# Patient Record
Sex: Male | Born: 1970 | Race: Black or African American | Hispanic: No | Marital: Married | Smoking: Never smoker
Health system: Southern US, Community
[De-identification: ages and names within clinical notes are randomized; demographics above are authoritative.]

## PROBLEM LIST (undated history)

## (undated) DIAGNOSIS — H269 Unspecified cataract: Secondary | ICD-10-CM

## (undated) DIAGNOSIS — K219 Gastro-esophageal reflux disease without esophagitis: Secondary | ICD-10-CM

## (undated) DIAGNOSIS — I10 Essential (primary) hypertension: Secondary | ICD-10-CM

## (undated) DIAGNOSIS — E119 Type 2 diabetes mellitus without complications: Secondary | ICD-10-CM

## (undated) DIAGNOSIS — F32A Depression, unspecified: Secondary | ICD-10-CM

## (undated) DIAGNOSIS — F329 Major depressive disorder, single episode, unspecified: Secondary | ICD-10-CM

## (undated) DIAGNOSIS — J302 Other seasonal allergic rhinitis: Secondary | ICD-10-CM

## (undated) HISTORY — DX: Essential (primary) hypertension: I10

## (undated) HISTORY — PX: OTHER SURGICAL HISTORY: SHX169

## (undated) HISTORY — DX: Type 2 diabetes mellitus without complications: E11.9

## (undated) HISTORY — DX: Unspecified cataract: H26.9

## (undated) HISTORY — PX: EYE SURGERY: SHX253

---

## 1999-09-04 ENCOUNTER — Emergency Department (HOSPITAL_COMMUNITY): Admission: EM | Admit: 1999-09-04 | Discharge: 1999-09-04 | Payer: Self-pay | Admitting: Emergency Medicine

## 2001-09-22 ENCOUNTER — Emergency Department (HOSPITAL_COMMUNITY): Admission: EM | Admit: 2001-09-22 | Discharge: 2001-09-22 | Payer: Self-pay

## 2004-08-23 HISTORY — PX: HAMMER TOE SURGERY: SHX385

## 2006-09-14 ENCOUNTER — Emergency Department (HOSPITAL_COMMUNITY): Admission: EM | Admit: 2006-09-14 | Discharge: 2006-09-14 | Payer: Self-pay | Admitting: Emergency Medicine

## 2006-09-24 ENCOUNTER — Emergency Department (HOSPITAL_COMMUNITY): Admission: EM | Admit: 2006-09-24 | Discharge: 2006-09-24 | Payer: Self-pay | Admitting: Emergency Medicine

## 2006-10-26 ENCOUNTER — Encounter: Admission: RE | Admit: 2006-10-26 | Discharge: 2006-10-26 | Payer: Self-pay | Admitting: Internal Medicine

## 2007-04-06 ENCOUNTER — Emergency Department (HOSPITAL_COMMUNITY): Admission: EM | Admit: 2007-04-06 | Discharge: 2007-04-06 | Payer: Self-pay | Admitting: Emergency Medicine

## 2007-05-02 ENCOUNTER — Encounter: Admission: RE | Admit: 2007-05-02 | Discharge: 2007-07-02 | Payer: Self-pay | Admitting: Internal Medicine

## 2008-02-17 ENCOUNTER — Encounter: Admission: RE | Admit: 2008-02-17 | Discharge: 2008-02-17 | Payer: Self-pay | Admitting: Internal Medicine

## 2008-03-20 ENCOUNTER — Encounter: Admission: RE | Admit: 2008-03-20 | Discharge: 2008-05-22 | Payer: Self-pay | Admitting: Internal Medicine

## 2009-08-23 DIAGNOSIS — H269 Unspecified cataract: Secondary | ICD-10-CM

## 2009-08-23 HISTORY — DX: Unspecified cataract: H26.9

## 2009-09-09 ENCOUNTER — Emergency Department (HOSPITAL_COMMUNITY): Admission: EM | Admit: 2009-09-09 | Discharge: 2009-09-09 | Payer: Self-pay | Admitting: Emergency Medicine

## 2015-08-11 ENCOUNTER — Ambulatory Visit (INDEPENDENT_AMBULATORY_CARE_PROVIDER_SITE_OTHER): Payer: BLUE CROSS/BLUE SHIELD

## 2015-08-11 ENCOUNTER — Ambulatory Visit (INDEPENDENT_AMBULATORY_CARE_PROVIDER_SITE_OTHER): Payer: BLUE CROSS/BLUE SHIELD | Admitting: Urgent Care

## 2015-08-11 VITALS — BP 162/100 | HR 80 | Temp 99.2°F | Resp 16 | Ht 72.0 in | Wt 282.0 lb

## 2015-08-11 DIAGNOSIS — I10 Essential (primary) hypertension: Secondary | ICD-10-CM | POA: Diagnosis not present

## 2015-08-11 DIAGNOSIS — R05 Cough: Secondary | ICD-10-CM

## 2015-08-11 DIAGNOSIS — R059 Cough, unspecified: Secondary | ICD-10-CM

## 2015-08-11 DIAGNOSIS — J988 Other specified respiratory disorders: Secondary | ICD-10-CM | POA: Diagnosis not present

## 2015-08-11 DIAGNOSIS — E1165 Type 2 diabetes mellitus with hyperglycemia: Secondary | ICD-10-CM

## 2015-08-11 DIAGNOSIS — IMO0001 Reserved for inherently not codable concepts without codable children: Secondary | ICD-10-CM

## 2015-08-11 DIAGNOSIS — R0789 Other chest pain: Secondary | ICD-10-CM

## 2015-08-11 DIAGNOSIS — E669 Obesity, unspecified: Secondary | ICD-10-CM

## 2015-08-11 DIAGNOSIS — J22 Unspecified acute lower respiratory infection: Secondary | ICD-10-CM

## 2015-08-11 DIAGNOSIS — R030 Elevated blood-pressure reading, without diagnosis of hypertension: Secondary | ICD-10-CM | POA: Diagnosis not present

## 2015-08-11 LAB — POCT CBC
Granulocyte percent: 73.9 %G (ref 37–80)
HCT, POC: 46 % (ref 43.5–53.7)
HEMOGLOBIN: 15.5 g/dL (ref 14.1–18.1)
LYMPH, POC: 2.3 (ref 0.6–3.4)
MCH, POC: 28.5 pg (ref 27–31.2)
MCHC: 33.8 g/dL (ref 31.8–35.4)
MCV: 84.4 fL (ref 80–97)
MID (cbc): 0.4 (ref 0–0.9)
MPV: 8.7 fL (ref 0–99.8)
PLATELET COUNT, POC: 232 10*3/uL (ref 142–424)
POC GRANULOCYTE: 7.7 — AB (ref 2–6.9)
POC LYMPH %: 22.4 % (ref 10–50)
POC MID %: 3.7 %M (ref 0–12)
RBC: 5.45 M/uL (ref 4.69–6.13)
RDW, POC: 15.4 %
WBC: 10.4 10*3/uL — AB (ref 4.6–10.2)

## 2015-08-11 LAB — COMPREHENSIVE METABOLIC PANEL
ALK PHOS: 77 U/L (ref 40–115)
ALT: 80 U/L — AB (ref 9–46)
AST: 54 U/L — AB (ref 10–40)
Albumin: 4 g/dL (ref 3.6–5.1)
BUN: 16 mg/dL (ref 7–25)
CO2: 27 mmol/L (ref 20–31)
Calcium: 9.2 mg/dL (ref 8.6–10.3)
Chloride: 101 mmol/L (ref 98–110)
Creat: 0.87 mg/dL (ref 0.60–1.35)
GLUCOSE: 90 mg/dL (ref 65–99)
POTASSIUM: 4.5 mmol/L (ref 3.5–5.3)
Sodium: 139 mmol/L (ref 135–146)
Total Bilirubin: 0.4 mg/dL (ref 0.2–1.2)
Total Protein: 7.1 g/dL (ref 6.1–8.1)

## 2015-08-11 LAB — POCT URINALYSIS DIP (MANUAL ENTRY)
Bilirubin, UA: NEGATIVE
Blood, UA: NEGATIVE
Glucose, UA: NEGATIVE
Ketones, POC UA: NEGATIVE
LEUKOCYTES UA: NEGATIVE
Nitrite, UA: NEGATIVE
PH UA: 7
PROTEIN UA: NEGATIVE
SPEC GRAV UA: 1.02
UROBILINOGEN UA: 0.2

## 2015-08-11 LAB — POCT GLYCOSYLATED HEMOGLOBIN (HGB A1C): HEMOGLOBIN A1C: 6.5

## 2015-08-11 LAB — TSH: TSH: 2.299 u[IU]/mL (ref 0.350–4.500)

## 2015-08-11 MED ORDER — BENZONATATE 100 MG PO CAPS: 100.0000 mg | ORAL_CAPSULE | Freq: Three times a day (TID) | ORAL | Status: DC | PRN

## 2015-08-11 MED ORDER — HYDROCODONE-HOMATROPINE 5-1.5 MG/5ML PO SYRP: 5.0000 mL | ORAL_SOLUTION | Freq: Every evening | ORAL | Status: DC | PRN

## 2015-08-11 MED ORDER — LISINOPRIL-HYDROCHLOROTHIAZIDE 10-12.5 MG PO TABS: 1.0000 | ORAL_TABLET | Freq: Every day | ORAL | Status: DC

## 2015-08-11 MED ORDER — AZITHROMYCIN 250 MG PO TABS: ORAL_TABLET | ORAL | Status: DC

## 2015-08-11 MED ORDER — METFORMIN HCL 500 MG PO TABS: 500.0000 mg | ORAL_TABLET | Freq: Every day | ORAL | Status: DC

## 2015-08-11 NOTE — Patient Instructions (Signed)
Hypertension Hypertension, commonly called high blood pressure, is when the force of blood pumping through your arteries is too strong. Your arteries are the blood vessels that carry blood from your heart throughout your body. A blood pressure reading consists of a higher number over a lower number, such as 110/72. The higher number (systolic) is the pressure inside your arteries when your heart pumps. The lower number (diastolic) is the pressure inside your arteries when your heart relaxes. Ideally you want your blood pressure below 120/80. Hypertension forces your heart to work harder to pump blood. Your arteries may become narrow or stiff. Having untreated or uncontrolled hypertension can cause heart attack, stroke, kidney disease, and other problems. RISK FACTORS Some risk factors for high blood pressure are controllable. Others are not.  Risk factors you cannot control include:   Race. You may be at higher risk if you are African American.  Age. Risk increases with age.  Gender. Men are at higher risk than women before age 45 years. After age 65, women are at higher risk than men. Risk factors you can control include:  Not getting enough exercise or physical activity.  Being overweight.  Getting too much fat, sugar, calories, or salt in your diet.  Drinking too much alcohol. SIGNS AND SYMPTOMS Hypertension does not usually cause signs or symptoms. Extremely high blood pressure (hypertensive crisis) may cause headache, anxiety, shortness of breath, and nosebleed. DIAGNOSIS To check if you have hypertension, your health care provider will measure your blood pressure while you are seated, with your arm held at the level of your heart. It should be measured at least twice using the same arm. Certain conditions can cause a difference in blood pressure between your right and left arms. A blood pressure reading that is higher than normal on one occasion does not mean that you need treatment. If  it is not clear whether you have high blood pressure, you may be asked to return on a different day to have your blood pressure checked again. Or, you may be asked to monitor your blood pressure at home for 1 or more weeks. TREATMENT Treating high blood pressure includes making lifestyle changes and possibly taking medicine. Living a healthy lifestyle can help lower high blood pressure. You may need to change some of your habits. Lifestyle changes may include:  Following the DASH diet. This diet is high in fruits, vegetables, and whole grains. It is low in salt, red meat, and added sugars.  Keep your sodium intake below 2,300 mg per day.  Getting at least 30-45 minutes of aerobic exercise at least 4 times per week.  Losing weight if necessary.  Not smoking.  Limiting alcoholic beverages.  Learning ways to reduce stress. Your health care provider may prescribe medicine if lifestyle changes are not enough to get your blood pressure under control, and if one of the following is true:  You are 18-59 years of age and your systolic blood pressure is above 140.  You are 60 years of age or older, and your systolic blood pressure is above 150.  Your diastolic blood pressure is above 90.  You have diabetes, and your systolic blood pressure is over 140 or your diastolic blood pressure is over 90.  You have kidney disease and your blood pressure is above 140/90.  You have heart disease and your blood pressure is above 140/90. Your personal target blood pressure may vary depending on your medical conditions, your age, and other factors. HOME CARE INSTRUCTIONS    Have your blood pressure rechecked as directed by your health care provider.   Take medicines only as directed by your health care provider. Follow the directions carefully. Blood pressure medicines must be taken as prescribed. The medicine does not work as well when you skip doses. Skipping doses also puts you at risk for  problems.  Do not smoke.   Monitor your blood pressure at home as directed by your health care provider. SEEK MEDICAL CARE IF:   You think you are having a reaction to medicines taken.  You have recurrent headaches or feel dizzy.  You have swelling in your ankles.  You have trouble with your vision. SEEK IMMEDIATE MEDICAL CARE IF:  You develop a severe headache or confusion.  You have unusual weakness, numbness, or feel faint.  You have severe chest or abdominal pain.  You vomit repeatedly.  You have trouble breathing. MAKE SURE YOU:   Understand these instructions.  Will watch your condition.  Will get help right away if you are not doing well or get worse.   This information is not intended to replace advice given to you by your health care provider. Make sure you discuss any questions you have with your health care provider.   Document Released: 08/09/2005 Document Revised: 12/24/2014 Document Reviewed: 06/01/2013 Elsevier Interactive Patient Education 2016 [employer]    Diabetes Mellitus and Food It is important for you to manage your blood sugar (glucose) level. Your blood glucose level can be greatly affected by what you eat. Eating healthier foods in the appropriate amounts throughout the day at about the same time each day will help you control your blood glucose level. It can also help slow or prevent worsening of your diabetes mellitus. Healthy eating may even help you improve the level of your blood pressure and reach or maintain a healthy weight.  General recommendations for healthful eating and cooking habits include:  Eating meals and snacks regularly. Avoid going long periods of time without eating to lose weight.  Eating a diet that consists mainly of plant-based foods, such as fruits, vegetables, nuts, legumes, and whole grains.  Using low-heat cooking methods, such as baking, instead of high-heat cooking methods, such as deep frying. Work with  your dietitian to make sure you understand how to use the Nutrition Facts information on food labels. HOW CAN FOOD AFFECT ME? Carbohydrates Carbohydrates affect your blood glucose level more than any other type of food. Your dietitian will help you determine how many carbohydrates to eat at each meal and teach you how to count carbohydrates. Counting carbohydrates is important to keep your blood glucose at a healthy level, especially if you are using insulin or taking certain medicines for diabetes mellitus. Alcohol Alcohol can cause sudden decreases in blood glucose (hypoglycemia), especially if you use insulin or take certain medicines for diabetes mellitus. Hypoglycemia can be a life-threatening condition. Symptoms of hypoglycemia (sleepiness, dizziness, and disorientation) are similar to symptoms of having too much alcohol.  If your health care provider has given you approval to drink alcohol, do so in moderation and use the following guidelines:  Women should not have more than one drink per day, and men should not have more than two drinks per day. One drink is equal to:  12 oz of beer.  5 oz of wine.  1 oz of hard liquor.  Do not drink on an empty stomach.  Keep yourself hydrated. Have water, diet soda, or unsweetened iced tea.  Regular soda, juice,  and other mixers might contain a lot of carbohydrates and should be counted. WHAT FOODS ARE NOT RECOMMENDED? As you make food choices, it is important to remember that all foods are not the same. Some foods have fewer nutrients per serving than other foods, even though they might have the same number of calories or carbohydrates. It is difficult to get your body what it needs when you eat foods with fewer nutrients. Examples of foods that you should avoid that are high in calories and carbohydrates but low in nutrients include:  Trans fats (most processed foods list trans fats on the Nutrition Facts label).  Regular  soda.  Juice.  Candy.  Sweets, such as cake, pie, doughnuts, and cookies.  Fried foods. WHAT FOODS CAN I EAT? Eat nutrient-rich foods, which will nourish your body and keep you healthy. The food you should eat also will depend on several factors, including:  The calories you need.  The medicines you take.  Your weight.  Your blood glucose level.  Your blood pressure level.  Your cholesterol level. You should eat a variety of foods, including:  Protein.  Lean cuts of meat.  Proteins low in saturated fats, such as fish, egg whites, and beans. Avoid processed meats.  Fruits and vegetables.  Fruits and vegetables that may help control blood glucose levels, such as apples, mangoes, and yams.  Dairy products.  Choose fat-free or low-fat dairy products, such as milk, yogurt, and cheese.  Grains, bread, pasta, and rice.  Choose whole grain products, such as multigrain bread, whole oats, and brown rice. These foods may help control blood pressure.  Fats.  Foods containing healthful fats, such as nuts, avocado, olive oil, canola oil, and fish. DOES EVERYONE WITH DIABETES MELLITUS HAVE THE SAME MEAL PLAN? Because every person with diabetes mellitus is different, there is not one meal plan that works for everyone. It is very important that you meet with a dietitian who will help you create a meal plan that is just right for you.   This information is not intended to replace advice given to you by your health care provider. Make sure you discuss any questions you have with your health care provider.   Document Released: 05/06/2005 Document Revised: 08/30/2014 Document Reviewed: 07/06/2013 Elsevier Interactive Patient Education [employer]

## 2015-08-11 NOTE — Progress Notes (Signed)
MRN: [id number] DOB: [birth year]  Subjective:   Trevor Garcia is a 44 y.o. male presenting for chief complaint of Sinusitis and Cough  Reports 4 day history of worsening cough which elicits chest discomfort and dizziness. He has also had sinus congestion, headache, scratchy throat, nausea. Has tried NyQuil, Neti pot, Mucinex with minimal relief. Denies fever, chest tightness, heart racing, vomiting, abdominal pain, lower leg swelling, ear pain, ear drainage, tooth pain, double vision, blurred vision. Admits that he has had a previous high blood pressure reading that qualified for hypertension. Diet is very unhealthy, eats a lot of fried foods, no exercise. Denies history of diabetes and heart disease. Denies smoking cigarettes or drinking alcohol.   Trevor Garcia currently has no medications in their medication list. Also has No Known Allergies.  Trevor Garcia  has no past medical history on file. Also  has no past surgical history on file.  Objective:   Vitals: BP 162/100 mmHg  Pulse 80  Temp(Src) 99.2 F (37.3 C) (Oral)  Resp 16  Ht 6' (1.829 m)  Wt 282 lb (127.914 kg)  BMI 38.24 kg/m2  SpO2 98%  Physical Exam  Constitutional: He is oriented to person, place, and time. He appears well-developed and well-nourished.  Body habitus is obese.  Cardiovascular: Normal rate, regular rhythm and intact distal pulses.  Exam reveals no gallop and no friction rub.   No murmur heard. Pulmonary/Chest: No respiratory distress. He has no wheezes. He has no rales.  Abdominal: Soft. Bowel sounds are normal. He exhibits no distension and no mass. There is no tenderness.  Neurological: He is alert and oriented to person, place, and time.  Skin: Skin is warm and dry. No rash noted. No erythema. No pallor.    Results for orders placed or performed in visit on 08/11/15 (from the past 24 hour(s))  POCT urinalysis dipstick     Status: None   Collection Time: 08/11/15  3:35 PM  Result Value Ref Range   Color, UA  yellow yellow   Clarity, UA clear clear   Glucose, UA negative negative   Bilirubin, UA negative negative   Ketones, POC UA negative negative   Spec Grav, UA 1.020    Blood, UA negative negative   pH, UA 7.0    Protein Ur, POC negative negative   Urobilinogen, UA 0.2    Nitrite, UA Negative Negative   Leukocytes, UA Negative Negative  POCT glycosylated hemoglobin (Hb A1C)     Status: None   Collection Time: 08/11/15  3:35 PM  Result Value Ref Range   Hemoglobin A1C 6.5   POCT CBC     Status: Abnormal   Collection Time: 08/11/15  3:35 PM  Result Value Ref Range   WBC 10.4 (A) 4.6 - 10.2 K/uL   Lymph, poc 2.3 0.6 - 3.4   POC LYMPH PERCENT 22.4 10 - 50 %L   MID (cbc) 0.4 0 - 0.9   POC MID % 3.7 0 - 12 %M   POC Granulocyte 7.7 (A) 2 - 6.9   Granulocyte percent 73.9 37 - 80 %G   RBC 5.45 4.69 - 6.13 M/uL   Hemoglobin 15.5 14.1 - 18.1 g/dL   HCT, POC 40.9 81.1 - 53.7 %   MCV 84.4 80 - 97 fL   MCH, POC 28.5 27 - 31.2 pg   MCHC 33.8 31.8 - 35.4 g/dL   RDW, POC 91.4 %   Platelet Count, POC 232 142 - 424 K/uL   MPV  8.7 0 - 99.8 fL   Assessment and Plan :   1. Cough -  Likely related to infectious process, unlikely to be heart failure or PE. See #7  2. Elevated blood pressure 3. Essential hypertension 4. Obesity - Start lis-HCT combo. RTC in 4 weeks for recheck. Advised lifestyle modifications.  5. Atypical chest pain - Likely related to infectious process, see #7  6. Uncontrolled type 2 diabetes mellitus without complication, without long-term current use of insulin (HCC) - Start metformin twice daily. Recommended significant dietary and lifestyle modifications.  7. Lower respiratory infection - Start Azithromycin, recheck in 1 week if no improvement  Wallis BambergMario Greyden Besecker, PA-C [facility] [phone] 08/11/2015 2:59 PM

## 2015-08-12 ENCOUNTER — Encounter: Payer: Self-pay | Admitting: Urgent Care

## 2015-09-11 ENCOUNTER — Ambulatory Visit (INDEPENDENT_AMBULATORY_CARE_PROVIDER_SITE_OTHER): Payer: BLUE CROSS/BLUE SHIELD | Admitting: Urgent Care

## 2015-09-11 ENCOUNTER — Encounter: Payer: Self-pay | Admitting: Urgent Care

## 2015-09-11 VITALS — BP 146/77 | HR 78 | Temp 98.5°F | Resp 16 | Ht 71.0 in | Wt 278.2 lb

## 2015-09-11 DIAGNOSIS — E119 Type 2 diabetes mellitus without complications: Secondary | ICD-10-CM

## 2015-09-11 DIAGNOSIS — Z0000 Encounter for general adult medical examination without abnormal findings: Secondary | ICD-10-CM | POA: Diagnosis not present

## 2015-09-11 DIAGNOSIS — E669 Obesity, unspecified: Secondary | ICD-10-CM

## 2015-09-11 DIAGNOSIS — L989 Disorder of the skin and subcutaneous tissue, unspecified: Secondary | ICD-10-CM | POA: Diagnosis not present

## 2015-09-11 DIAGNOSIS — Z23 Encounter for immunization: Secondary | ICD-10-CM

## 2015-09-11 DIAGNOSIS — I10 Essential (primary) hypertension: Secondary | ICD-10-CM | POA: Diagnosis not present

## 2015-09-11 LAB — LIPID PANEL
CHOLESTEROL: 164 mg/dL (ref 125–200)
HDL: 30 mg/dL — ABNORMAL LOW (ref >=40)
LDL Cholesterol: 110 mg/dL (ref <130)
Total CHOL/HDL Ratio: 5.5 Ratio — ABNORMAL HIGH (ref <=5.0)
Triglycerides: 122 mg/dL (ref <150)
VLDL: 24 mg/dL (ref <30)

## 2015-09-11 LAB — CBC
HCT: 45.6 % (ref 39.0–52.0)
Hemoglobin: 15.4 g/dL (ref 13.0–17.0)
MCH: 28.3 pg (ref 26.0–34.0)
MCHC: 33.8 g/dL (ref 30.0–36.0)
MCV: 83.7 fL (ref 78.0–100.0)
MPV: 11.5 fL (ref 8.6–12.4)
PLATELETS: 265 10*3/uL (ref 150–400)
RBC: 5.45 MIL/uL (ref 4.22–5.81)
RDW: 15.4 % (ref 11.5–15.5)
WBC: 10.3 10*3/uL (ref 4.0–10.5)

## 2015-09-11 LAB — COMPREHENSIVE METABOLIC PANEL
ALK PHOS: 77 U/L (ref 40–115)
ALT: 89 U/L — AB (ref 9–46)
AST: 59 U/L — AB (ref 10–40)
Albumin: 4.2 g/dL (ref 3.6–5.1)
BUN: 17 mg/dL (ref 7–25)
CO2: 24 mmol/L (ref 20–31)
CREATININE: 0.86 mg/dL (ref 0.60–1.35)
Calcium: 9.6 mg/dL (ref 8.6–10.3)
Chloride: 102 mmol/L (ref 98–110)
GLUCOSE: 92 mg/dL (ref 65–99)
Potassium: 4.3 mmol/L (ref 3.5–5.3)
SODIUM: 138 mmol/L (ref 135–146)
TOTAL PROTEIN: 7 g/dL (ref 6.1–8.1)
Total Bilirubin: 0.8 mg/dL (ref 0.2–1.2)

## 2015-09-11 LAB — TSH: TSH: 0.914 u[IU]/mL (ref 0.350–4.500)

## 2015-09-11 NOTE — Progress Notes (Signed)
MRN: [id number]  Subjective:   Trevor Garcia is a 45 y.o. male presenting for annual physical exam.  Medical care team includes: PCP: No primary care provider on file. Vision: Needs Dental: Gets dental cleanings, has an appointment scheduled for 09/215. [facility] Works in [place], [place]. Specialists: None.   Patient works with [employer], is married to his wife for ~20 years. They do not have children but have a chihuahua that they love dearly. Patient states that he has good relationship with his wife at home, has good support network. Denies smoking cigarettes or drinking alcohol.   HTN - managed with low dose lis-HCTZ. He admits good compliance, misses some doses. However, patient has had significant dietary modifications and has started exercising 3 times a week. Denies lightheadedness, dizziness, chronic headache, double vision, chest pain, shortness of breath, heart racing, palpitations, nausea, vomiting, abdominal pain, hematuria, lower leg swelling.   DM - managed with Metformin  BID. Diet and exercise as above. Denies blurred vision, polyuria, polydipsia, foot infections, numbness or tingling.  Trevor Garcia  does not have a problem list on file.  Trevor Garcia has a current medication list which includes the following prescription(s): lisinopril-hydrochlorothiazide and metformin. He has No Known Allergies.  Trevor Garcia  has a past medical history of Cataract (2011); Hypertension; and Diabetes mellitus without complication (HCC). Also  has no past surgical history on file.  His family history includes GER disease in his sister; Lupus in his mother.  Immunizations: Not interested in flu shots, TDAP is due.  Review of Systems  Constitutional: Negative for fever, chills, weight loss, malaise/fatigue and diaphoresis.  HENT: Negative for congestion, ear discharge, ear pain, hearing loss, nosebleeds, sore throat and tinnitus.   Eyes: Negative for blurred vision, double vision, photophobia, pain,  discharge and redness.  Respiratory: Negative for cough, shortness of breath and wheezing.   Cardiovascular: Negative for chest pain, palpitations and leg swelling.  Gastrointestinal: Negative for nausea, vomiting, abdominal pain, diarrhea, constipation and blood in stool.  Genitourinary: Negative for dysuria, urgency, frequency, hematuria and flank pain.  Musculoskeletal: Negative for myalgias, back pain and joint pain.  Skin: Negative for itching and rash.  Neurological: Negative for dizziness, tingling, seizures, loss of consciousness, weakness and headaches.  Endo/Heme/Allergies: Negative for polydipsia.  Psychiatric/Behavioral: Negative for depression, suicidal ideas, hallucinations, memory loss and substance abuse. The patient is not nervous/anxious and does not have insomnia.    Objective:   Vitals: BP 146/77 mmHg  Pulse 78  Temp(Src) 98.5 F (36.9 C) (Oral)  Resp 16  Ht  (1.803 m)  Wt 278 lb 3.2 oz (126.191 kg)  BMI 38.82 kg/m2  SpO2 96%  BP Readings from Last 3 Encounters:  09/11/15 146/77  08/11/15 162/100   Physical Exam  Constitutional: He is oriented to person, place, and time. He appears well-developed and well-nourished.  HENT:  TM's intact bilaterally, no effusions or erythema. Nasal turbinates pink and moist, nasal passages patent. No sinus tenderness. Oropharynx clear, mucous membranes moist, dentition in good repair.  Eyes: Conjunctivae and EOM are normal. Pupils are equal, round, and reactive to light. Right eye exhibits no discharge. Left eye exhibits no discharge. No scleral icterus.  Neck: Normal range of motion. Neck supple. No thyromegaly present.  Cardiovascular: Normal rate, regular rhythm and intact distal pulses.  Exam reveals no gallop and no friction rub.   No murmur heard. Pulmonary/Chest: No stridor. No respiratory distress. He has no wheezes. He has no rales.  Abdominal: Soft. Bowel sounds  are normal. He exhibits no distension and no  mass. There is no tenderness.  Musculoskeletal: Normal range of motion. He exhibits no edema or tenderness.  Lymphadenopathy:    He has no cervical adenopathy.  Neurological: He is alert and oriented to person, place, and time. He has normal reflexes.  Skin: Skin is warm and dry. No rash noted. No erythema. No pallor.  Multiple hyperkeratotic growths over limbs, chest and posterior scalp.  Psychiatric: He has a normal mood and affect.   Assessment and Plan :   1. Annual physical exam - Medically stable, labs pending - Discussed healthy lifestyle, diet, exercise, preventative care, vaccinations, and addressed patient's concerns.   2. Skin lesions - Multiple hyperkeratotic skin lesions throughout body, patient would like these evaluated by a dermatologist. - Ambulatory referral to Dermatology  3. Essential hypertension 4. Obesity 5. Type 2 diabetes mellitus without complication, without long-term current use of insulin (HCC) - Continue with current regimen for HTN, DM. Encouraged to continue with healthy diet and exercise. Recheck in 2 months for diabetes.  6. Need for Tdap vaccination - Tdap vaccine greater than or equal to 7yo IM   Wallis Bamberg, PA-C [facility] [phone] 09/11/2015  2:31 PM

## 2015-09-11 NOTE — Patient Instructions (Addendum)
Keeping you healthy  Get these tests  Blood pressure- Have your blood pressure checked once a year by your healthcare provider.  Normal blood pressure is 120/80.  Weight- Have your body mass index (BMI) calculated to screen for obesity.  BMI is a measure of body fat based on height and weight. You can also calculate your own BMI at [email or web address]/.  Cholesterol- Have your cholesterol checked regularly starting at age 46, sooner may be necessary if you have diabetes, high blood pressure, if a family member developed heart diseases at an early age or if you smoke.   Chlamydia, HIV, and other sexual transmitted disease- Get screened each year until the age of 25 then within three months of each new sexual partner.  Diabetes- Have your blood sugar checked regularly if you have high blood pressure, high cholesterol, a family history of diabetes or if you are overweight.  Get these vaccines  Flu shot- Every fall.  Tetanus shot- Every 10 years.  Menactra- Single dose; prevents meningitis.  Take these steps  Don't smoke- If you do smoke, ask your healthcare provider about quitting. For tips on how to quit, go to www.smokefree.gov or call 1-800-QUIT-NOW.  Be physically active- Exercise 5 days a week for at least 30 minutes.  If you are not already physically active start slow and gradually work up to 30 minutes of moderate physical activity.  Examples of moderate activity include walking briskly, mowing the yard, dancing, swimming bicycling, etc.  Eat a healthy diet- Eat a variety of healthy foods such as fruits, vegetables, low fat milk, low fat cheese, yogurt, lean meats, poultry, fish, beans, tofu, etc.  For more information on healthy eating, go to www.thenutritionsource.org  Drink alcohol in moderation- Limit alcohol intake two drinks or less a day.  Never drink and drive.  Dentist- Brush and floss teeth twice daily; visit your dentis twice a year.  Depression-Your emotional  health is as important as your physical health.  If you're feeling down, losing interest in things you normally enjoy please talk with your healthcare provider.  Gun Safety- If you keep a gun in your home, keep it unloaded and with the safety lock on.  Bullets should be stored separately.  Helmet use- Always wear a helmet when riding a motorcycle, bicycle, rollerblading or skateboarding.  Safe sex- If you may be exposed to a sexually transmitted infection, use a condom  Seat belts- Seat bels can save your life; always wear one.  Smoke/Carbon Monoxide detectors- These detectors need to be installed on the appropriate level of your home.  Replace batteries at least once a year.  Skin Cancer- When out in the sun, cover up and use sunscreen SPF 15 or higher.  Violence- If anyone is threatening or hurting you, please tell your healthcare provider.    Hypertension Hypertension, commonly called high blood pressure, is when the force of blood pumping through your arteries is too strong. Your arteries are the blood vessels that carry blood from your heart throughout your body. A blood pressure reading consists of a higher number over a lower number, such as 110/72. The higher number (systolic) is the pressure inside your arteries when your heart pumps. The lower number (diastolic) is the pressure inside your arteries when your heart relaxes. Ideally you want your blood pressure below 120/80. Hypertension forces your heart to work harder to pump blood. Your arteries may become narrow or stiff. Having untreated or uncontrolled hypertension can cause heart attack, stroke, kidney disease, and   other problems. RISK FACTORS Some risk factors for high blood pressure are controllable. Others are not.  Risk factors you cannot control include:   Race. You may be at higher risk if you are African American.  Age. Risk increases with age.  Gender. Men are at higher risk than women before age 45 years. After  age 65, women are at higher risk than men. Risk factors you can control include:  Not getting enough exercise or physical activity.  Being overweight.  Getting too much fat, sugar, calories, or salt in your diet.  Drinking too much alcohol. SIGNS AND SYMPTOMS Hypertension does not usually cause signs or symptoms. Extremely high blood pressure (hypertensive crisis) may cause headache, anxiety, shortness of breath, and nosebleed. DIAGNOSIS To check if you have hypertension, your health care provider will measure your blood pressure while you are seated, with your arm held at the level of your heart. It should be measured at least twice using the same arm. Certain conditions can cause a difference in blood pressure between your right and left arms. A blood pressure reading that is higher than normal on one occasion does not mean that you need treatment. If it is not clear whether you have high blood pressure, you may be asked to return on a different day to have your blood pressure checked again. Or, you may be asked to monitor your blood pressure at home for 1 or more weeks. TREATMENT Treating high blood pressure includes making lifestyle changes and possibly taking medicine. Living a healthy lifestyle can help lower high blood pressure. You may need to change some of your habits. Lifestyle changes may include:  Following the DASH diet. This diet is high in fruits, vegetables, and whole grains. It is low in salt, red meat, and added sugars.  Keep your sodium intake below 2,300 mg per day.  Getting at least 30-45 minutes of aerobic exercise at least 4 times per week.  Losing weight if necessary.  Not smoking.  Limiting alcoholic beverages.  Learning ways to reduce stress. Your health care provider may prescribe medicine if lifestyle changes are not enough to get your blood pressure under control, and if one of the following is true:  You are 18-59 years of age and your systolic blood  pressure is above 140.  You are 60 years of age or older, and your systolic blood pressure is above 150.  Your diastolic blood pressure is above 90.  You have diabetes, and your systolic blood pressure is over 140 or your diastolic blood pressure is over 90.  You have kidney disease and your blood pressure is above 140/90.  You have heart disease and your blood pressure is above 140/90. Your personal target blood pressure may vary depending on your medical conditions, your age, and other factors. HOME CARE INSTRUCTIONS  Have your blood pressure rechecked as directed by your health care provider.   Take medicines only as directed by your health care provider. Follow the directions carefully. Blood pressure medicines must be taken as prescribed. The medicine does not work as well when you skip doses. Skipping doses also puts you at risk for problems.  Do not smoke.   Monitor your blood pressure at home as directed by your health care provider. SEEK MEDICAL CARE IF:   You think you are having a reaction to medicines taken.  You have recurrent headaches or feel dizzy.  You have swelling in your ankles.  You have trouble with your vision. SEEK IMMEDIATE   CARE IF:  You develop a severe headache or confusion.  You have unusual weakness, numbness, or feel faint.  You have severe chest or abdominal pain.  You vomit repeatedly.  You have trouble breathing. MAKE SURE YOU:   Understand these instructions.  Will watch your condition.  Will get help right away if you are not doing well or get worse.   This information is not intended to replace advice given to you by your health care provider. Make sure you discuss any questions you have with your health care provider.   Document Released: 08/09/2005 Document Revised: 12/24/2014 Document Reviewed: 06/01/2013 Elsevier Interactive Patient Education 2016 [employer].    Diabetes Mellitus and Food It is important  for you to manage your blood sugar (glucose) level. Your blood glucose level can be greatly affected by what you eat. Eating healthier foods in the appropriate amounts throughout the day at about the same time each day will help you control your blood glucose level. It can also help slow or prevent worsening of your diabetes mellitus. Healthy eating may even help you improve the level of your blood pressure and reach or maintain a healthy weight.  General recommendations for healthful eating and cooking habits include:  Eating meals and snacks regularly. Avoid going long periods of time without eating to lose weight.  Eating a diet that consists mainly of plant-based foods, such as fruits, vegetables, nuts, legumes, and whole grains.  Using low-heat cooking methods, such as baking, instead of high-heat cooking methods, such as deep frying. Work with your dietitian to make sure you understand how to use the Nutrition Facts information on food labels. HOW CAN FOOD AFFECT ME? Carbohydrates Carbohydrates affect your blood glucose level more than any other type of food. Your dietitian will help you determine how many carbohydrates to eat at each meal and teach you how to count carbohydrates. Counting carbohydrates is important to keep your blood glucose at a healthy level, especially if you are using insulin or taking certain medicines for diabetes mellitus. Alcohol Alcohol can cause sudden decreases in blood glucose (hypoglycemia), especially if you use insulin or take certain medicines for diabetes mellitus. Hypoglycemia can be a life-threatening condition. Symptoms of hypoglycemia (sleepiness, dizziness, and disorientation) are similar to symptoms of having too much alcohol.  If your health care provider has given you approval to drink alcohol, do so in moderation and use the following guidelines:  Women should not have more than one drink per day, and men should not have more than two drinks per day.  One drink is equal to:  12 oz of beer.  5 oz of wine.  1 oz of hard liquor.  Do not drink on an empty stomach.  Keep yourself hydrated. Have water, diet soda, or unsweetened iced tea.  Regular soda, juice, and other mixers might contain a lot of carbohydrates and should be counted. WHAT FOODS ARE NOT RECOMMENDED? As you make food choices, it is important to remember that all foods are not the same. Some foods have fewer nutrients per serving than other foods, even though they might have the same number of calories or carbohydrates. It is difficult to get your body what it needs when you eat foods with fewer nutrients. Examples of foods that you should avoid that are high in calories and carbohydrates but low in nutrients include:  Trans fats (most processed foods list trans fats on the Nutrition Facts label).  Regular soda.  Juice.  Candy.  Sweets,  such as cake, pie, doughnuts, and cookies.  Fried foods. WHAT FOODS CAN I EAT? Eat nutrient-rich foods, which will nourish your body and keep you healthy. The food you should eat also will depend on several factors, including:  The calories you need.  The medicines you take.  Your weight.  Your blood glucose level.  Your blood pressure level.  Your cholesterol level. You should eat a variety of foods, including:  Protein.  Lean cuts of meat.  Proteins low in saturated fats, such as fish, egg whites, and beans. Avoid processed meats.  Fruits and vegetables.  Fruits and vegetables that may help control blood glucose levels, such as apples, mangoes, and yams.  Dairy products.  Choose fat-free or low-fat dairy products, such as milk, yogurt, and cheese.  Grains, bread, pasta, and rice.  Choose whole grain products, such as multigrain bread, whole oats, and brown rice. These foods may help control blood pressure.  Fats.  Foods containing healthful fats, such as nuts, avocado, olive oil, canola oil, and fish. DOES  EVERYONE WITH DIABETES MELLITUS HAVE THE SAME MEAL PLAN? Because every person with diabetes mellitus is different, there is not one meal plan that works for everyone. It is very important that you meet with a dietitian who will help you create a meal plan that is just right for you.   This information is not intended to replace advice given to you by your health care provider. Make sure you discuss any questions you have with your health care provider.   Document Released: 05/06/2005 Document Revised: 08/30/2014 Document Reviewed: 07/06/2013 Elsevier Interactive Patient Education [employer]

## 2015-09-18 ENCOUNTER — Encounter: Payer: Self-pay | Admitting: Urgent Care

## 2015-11-06 ENCOUNTER — Ambulatory Visit: Payer: BLUE CROSS/BLUE SHIELD | Admitting: Urgent Care

## 2015-11-20 ENCOUNTER — Ambulatory Visit: Payer: BLUE CROSS/BLUE SHIELD | Admitting: Urgent Care

## 2016-10-21 ENCOUNTER — Encounter: Payer: BLUE CROSS/BLUE SHIELD | Admitting: Urgent Care

## 2016-11-25 ENCOUNTER — Ambulatory Visit (INDEPENDENT_AMBULATORY_CARE_PROVIDER_SITE_OTHER): Payer: BLUE CROSS/BLUE SHIELD | Admitting: Physician Assistant

## 2016-11-25 VITALS — BP 150/100 | HR 80 | Temp 98.1°F | Resp 18 | Ht 71.0 in | Wt 296.8 lb

## 2016-11-25 DIAGNOSIS — J209 Acute bronchitis, unspecified: Secondary | ICD-10-CM

## 2016-11-25 DIAGNOSIS — R05 Cough: Secondary | ICD-10-CM | POA: Diagnosis not present

## 2016-11-25 DIAGNOSIS — R0981 Nasal congestion: Secondary | ICD-10-CM | POA: Diagnosis not present

## 2016-11-25 DIAGNOSIS — R059 Cough, unspecified: Secondary | ICD-10-CM

## 2016-11-25 MED ORDER — HYDROCODONE-HOMATROPINE 5-1.5 MG/5ML PO SYRP: 5.0000 mL | ORAL_SOLUTION | Freq: Three times a day (TID) | ORAL | 0 refills | Status: DC | PRN

## 2016-11-25 MED ORDER — AZITHROMYCIN 250 MG PO TABS: ORAL_TABLET | ORAL | 0 refills | Status: DC

## 2016-11-25 MED ORDER — MUCINEX DM MAXIMUM STRENGTH 60-1200 MG PO TB12: 1.0000 | ORAL_TABLET | Freq: Two times a day (BID) | ORAL | 1 refills | Status: DC

## 2016-11-25 MED ORDER — FLUTICASONE PROPIONATE 50 MCG/ACT NA SUSP: 2.0000 | Freq: Every day | NASAL | 6 refills | Status: DC

## 2016-11-25 NOTE — Patient Instructions (Addendum)
Drink 2-3 liters/water a day - especially while taking mucinex. This will help thin out your mucus to help you clear it.  Hycodan is a controlled substance. Please take this as directed - it is intended to help you get a good night's rest.  Flonase - 2 sprays each nostril 2x/day - one of those doses should be before bedtime.  Warm tea with honey to help soothe your sore throat. Warm salt water gargles will help with this as well.   Check out Hello Fresh, Blue apron, Fresh 20.   Thank you for coming in today. I hope you feel we met your needs.  Feel free to call [facility] if you have any questions or further requests.  Please consider signing up for MyChart if you do not already have it, as this is a great way to communicate with me.  Best,  [employer], PA-C   Exercising to [employer] Exercising can help you to lose weight. In order to lose weight through exercise, you need to do vigorous-intensity exercise. You can tell that you are exercising with vigorous intensity if you are breathing very hard and fast and cannot hold a conversation while exercising. Moderate-intensity exercise helps to maintain your current weight. You can tell that you are exercising at a moderate level if you have a higher heart rate and faster breathing, but you are still able to hold a conversation. How often should I exercise? Choose an activity that you enjoy and set realistic goals. Your health care provider can help you to make an activity plan that works for you. Exercise regularly as directed by your health care provider. This may include:  Doing resistance training twice each week, such as:  Push-ups.  Sit-ups.  Lifting weights.  Using resistance bands.  Doing a given intensity of exercise for a given amount of time. Choose from these options:  150 minutes of moderate-intensity exercise every week.  75 minutes of vigorous-intensity exercise every week.  A mix of moderate-intensity and  vigorous-intensity exercise every week. Children, pregnant women, people who are out of shape, people who are overweight, and older adults may need to consult a health care provider for individual recommendations. If you have any sort of medical condition, be sure to consult your health care provider before starting a new exercise program. What are some activities that can help me to lose weight?  Walking at a rate of at least 4.5 miles an hour.  Jogging or running at a rate of 5 miles per hour.  Biking at a rate of at least 10 miles per hour.  Lap swimming.  Roller-skating or in-line skating.  Cross-country skiing.  Vigorous competitive sports, such as football, basketball, and soccer.  Jumping rope.  Aerobic dancing. How can I be more active in my day-to-day activities?  Use the stairs instead of the elevator.  Take a walk during your lunch break.  If you drive, park your car farther away from work or school.  If you take public transportation, get off one stop early and walk the rest of the way.  Make all of your phone calls while standing up and walking around.  Get up, stretch, and walk around every 30 minutes throughout the day. What guidelines should I follow while exercising?  Do not exercise so much that you hurt yourself, feel dizzy, or get very short of breath.  Consult your health care provider prior to starting a new exercise program.  Wear comfortable clothes and shoes with  good support.  Drink plenty of water while you exercise to prevent dehydration or heat stroke. Body water is lost during exercise and must be replaced.  Work out until you breathe faster and your heart beats faster. This information is not intended to replace advice given to you by your health care provider. Make sure you discuss any questions you have with your health care provider. Document Released: 09/11/2010 Document Revised: 01/15/2016 Document Reviewed: 01/10/2014 Elsevier  Interactive Patient Education  2017 [employer] DASH stands for "Dietary Approaches to Stop Hypertension." The DASH eating plan is a healthy eating plan that has been shown to reduce high blood pressure (hypertension). It may also reduce your risk for type 2 diabetes, heart disease, and stroke. The DASH eating plan may also help with weight loss. What are tips for following this plan? General guidelines   Avoid eating more than 2,300 mg (milligrams) of salt (sodium) a day. If you have hypertension, you may need to reduce your sodium intake to 1,500 mg a day.  Limit alcohol intake to no more than 1 drink a day for nonpregnant women and 2 drinks a day for men. One drink equals 12 oz of beer, 5 oz of wine, or 1 oz of hard liquor.  Work with your health care provider to maintain a healthy body weight or to lose weight. Ask what an ideal weight is for you.  Get at least 30 minutes of exercise that causes your heart to beat faster (aerobic exercise) most days of the week. Activities may include walking, swimming, or biking.  Work with your health care provider or diet and nutrition specialist (dietitian) to adjust your eating plan to your individual calorie needs. Reading food labels   Check food labels for the amount of sodium per serving. Choose foods with less than 5 percent of the Daily Value of sodium. Generally, foods with less than 300 mg of sodium per serving fit into this eating plan.  To find whole grains, look for the word "whole" as the first word in the ingredient list. Shopping   Buy products labeled as "low-sodium" or "no salt added."  Buy fresh foods. Avoid canned foods and premade or frozen meals. Cooking   Avoid adding salt when cooking. Use salt-free seasonings or herbs instead of table salt or sea salt. Check with your health care provider or pharmacist before using salt substitutes.  Do not fry foods. Cook foods using healthy methods such as  baking, boiling, grilling, and broiling instead.  Cook with heart-healthy oils, such as olive, canola, soybean, or sunflower oil. Meal planning    Eat a balanced diet that includes:  5 or more servings of fruits and vegetables each day. At each meal, try to fill half of your plate with fruits and vegetables.  Up to 6-8 servings of whole grains each day.  Less than 6 oz of lean meat, poultry, or fish each day. A 3-oz serving of meat is about the same size as a deck of cards. One egg equals 1 oz.  2 servings of low-fat dairy each day.  A serving of nuts, seeds, or beans 5 times each week.  Heart-healthy fats. Healthy fats called Omega-3 fatty acids are found in foods such as flaxseeds and coldwater fish, like sardines, salmon, and mackerel.  Limit how much you eat of the following:  Canned or prepackaged foods.  Food that is high in trans fat, such as fried foods.  Food that is  high in saturated fat, such as fatty meat.  Sweets, desserts, sugary drinks, and other foods with added sugar.  Full-fat dairy products.  Do not salt foods before eating.  Try to eat at least 2 vegetarian meals each week.  Eat more home-cooked food and less restaurant, buffet, and fast food.  When eating at a restaurant, ask that your food be prepared with less salt or no salt, if possible. What foods are recommended? The items listed may not be a complete list. Talk with your dietitian about what dietary choices are best for you. Grains  Whole-grain or whole-wheat bread. Whole-grain or whole-wheat pasta. Brown rice. [place]. Bulgur. Whole-grain and low-sodium cereals. Pita bread. Low-fat, low-sodium crackers. Whole-wheat flour tortillas. Vegetables  Fresh or frozen vegetables (raw, steamed, roasted, or grilled). Low-sodium or reduced-sodium tomato and vegetable juice. Low-sodium or reduced-sodium tomato sauce and tomato paste. Low-sodium or reduced-sodium canned vegetables. Fruits  All  fresh, dried, or frozen fruit. Canned fruit in natural juice (without added sugar). Meat and other protein foods  Skinless chicken or [place]. Ground chicken or [place]. Pork with fat trimmed off. Fish and seafood. Egg whites. Dried beans, peas, or lentils. Unsalted nuts, nut butters, and seeds. Unsalted canned beans. Lean cuts of beef with fat trimmed off. Low-sodium, lean deli meat. Dairy  Low-fat (1%) or fat-free (skim) milk. Fat-free, low-fat, or reduced-fat cheeses. Nonfat, low-sodium ricotta or cottage cheese. Low-fat or nonfat yogurt. Low-fat, low-sodium cheese. Fats and oils  Soft margarine without trans fats. Vegetable oil. Low-fat, reduced-fat, or light mayonnaise and salad dressings (reduced-sodium). Canola, safflower, olive, soybean, and sunflower oils. Avocado. Seasoning and other foods  Herbs. Spices. Seasoning mixes without salt. Unsalted popcorn and pretzels. Fat-free sweets. What foods are not recommended? The items listed may not be a complete list. Talk with your dietitian about what dietary choices are best for you. Grains  Baked goods made with fat, such as croissants, muffins, or some breads. Dry pasta or rice meal packs. Vegetables  Creamed or fried vegetables. Vegetables in a cheese sauce. Regular canned vegetables (not low-sodium or reduced-sodium). Regular canned tomato sauce and paste (not low-sodium or reduced-sodium). Regular tomato and vegetable juice (not low-sodium or reduced-sodium). Angie Fava. Olives. Fruits  Canned fruit in a light or heavy syrup. Fried fruit. Fruit in cream or butter sauce. Meat and other protein foods  Fatty cuts of meat. Ribs. Fried meat. Berniece Salines. Sausage. Bologna and other processed lunch meats. Salami. Fatback. Hotdogs. Bratwurst. Salted nuts and seeds. Canned beans with added salt. Canned or smoked fish. Whole eggs or egg yolks. Chicken or [place] with skin. Dairy  Whole or 2% milk, cream, and half-and-half. Whole or full-fat cream cheese.  Whole-fat or sweetened yogurt. Full-fat cheese. Nondairy creamers. Whipped toppings. Processed cheese and cheese spreads. Fats and oils  Butter. Stick margarine. Lard. Shortening. Ghee. Bacon fat. Tropical oils, such as coconut, palm kernel, or palm oil. Seasoning and other foods  Salted popcorn and pretzels. Onion salt, garlic salt, seasoned salt, table salt, and sea salt. Worcestershire sauce. Tartar sauce. Barbecue sauce. Teriyaki sauce. Soy sauce, including reduced-sodium. Steak sauce. Canned and packaged gravies. Fish sauce. Oyster sauce. Cocktail sauce. Horseradish that you find on the shelf. Ketchup. Mustard. Meat flavorings and tenderizers. Bouillon cubes. Hot sauce and Tabasco sauce. Premade or packaged marinades. Premade or packaged taco seasonings. Relishes. Regular salad dressings. Where to find more information:  National Heart, Lung, and [place]: https://wilson-eaton.com/  American Heart Association: www.heart.org Summary  The DASH eating plan is a  healthy eating plan that has been shown to reduce high blood pressure (hypertension). It may also reduce your risk for type 2 diabetes, heart disease, and stroke.  With the DASH eating plan, you should limit salt (sodium) intake to 2,300 mg a day. If you have hypertension, you may need to reduce your sodium intake to 1,500 mg a day.  When on the DASH eating plan, aim to eat more fresh fruits and vegetables, whole grains, lean proteins, low-fat dairy, and heart-healthy fats.  Work with your health care provider or diet and nutrition specialist (dietitian) to adjust your eating plan to your individual calorie needs. This information is not intended to replace advice given to you by your health care provider. Make sure you discuss any questions you have with your health care provider. Document Released: 07/29/2011 Document Revised: 08/02/2016 Document Reviewed: 08/02/2016 Elsevier Interactive Patient Education  2017 [employer].  IF you  received an x-ray today, you will receive an invoice from [facility]. Please contact [facility] at [phone] with questions or concerns regarding your invoice.   IF you received labwork today, you will receive an invoice from [place]. Please contact [facility] at [phone] with questions or concerns regarding your invoice.   Our billing staff will not be able to assist you with questions regarding bills from these companies.  You will be contacted with the lab results as soon as they are available. The fastest way to get your results is to activate your My Chart account. Instructions are located on the last page of this paperwork. If you have not heard from [place] regarding the results in 2 weeks, please contact this office.

## 2016-11-25 NOTE — Progress Notes (Signed)
   Trevor Garcia  MRN: [id number] DOB: [birth year]  PCP: No PCP Per Patient  Subjective:  Pt is a 46 year old male who presents to clinic for illness x 1.5 weeks.  Cough x 1.5 weeks. Cough has led to abdominal pain on his right side.  Productive cough.  Not sleeping due to cough. Cough is worse when he lays down. His symptoms are worsening.  + Body aches, headaches last week. Denies SOB, wheezing, palpitations, fever, chills.  Tried gargling peroxide, Nyquil, aleve.   Review of Systems  Constitutional: Negative for chills, diaphoresis and fever.  HENT: Positive for congestion and voice change.   Respiratory: Positive for cough. Negative for chest tightness, shortness of breath and wheezing.   Cardiovascular: Negative for chest pain and palpitations.  Gastrointestinal: Positive for abdominal pain (right side). Negative for diarrhea, nausea and vomiting.  Neurological: Positive for headaches. Negative for dizziness, syncope and light-headedness.  Psychiatric/Behavioral: Positive for sleep disturbance.    There are no active problems to display for this patient.   Current Outpatient Prescriptions on File Prior to Visit  Medication Sig Dispense Refill  . lisinopril-hydrochlorothiazide (PRINZIDE,ZESTORETIC) 10-12.5 MG tablet Take 1 tablet by mouth daily.  3  . metFORMIN (GLUCOPHAGE) 500 MG tablet Take 1 tablet (500 mg total) by mouth daily with breakfast. (Patient not taking: Reported on 11/25/2016) 90 tablet 3   No current facility-administered medications on file prior to visit.     No Known Allergies   Objective:  BP (!) 163/93   Pulse 80   Temp 98.1 F (36.7 C) (Oral)   Resp 18   Ht  (1.803 m)   Wt 296 lb 12.8 oz (134.6 kg)   SpO2 95%   BMI 41.40 kg/m   Physical Exam  Constitutional: He is oriented to person, place, and time and well-developed, well-nourished, and in no distress. No distress.  HENT:  Right Ear: Tympanic membrane normal.  Left Ear: Tympanic  membrane normal.  Mouth/Throat: Mucous membranes are normal. Posterior oropharyngeal edema present. No oropharyngeal exudate or posterior oropharyngeal erythema.  Cardiovascular: Normal rate, regular rhythm and normal heart sounds.   Pulmonary/Chest: Effort normal. He has no wheezes.  Neurological: He is alert and oriented to person, place, and time. GCS score is 15.  Skin: Skin is warm and dry.  Psychiatric: Mood, memory, affect and judgment normal.  Vitals reviewed.   Assessment and Plan :  1. Cough 2. Acute bronchitis, unspecified organism 3. Nasal congestion - Dextromethorphan-Guaifenesin (MUCINEX DM MAXIMUM STRENGTH) 60-1200 MG TB12; Take 1 tablet by mouth every 12 (twelve) hours.  Dispense: 14 each; Refill: 1 - HYDROcodone-homatropine (HYCODAN) 5-1.5 MG/5ML syrup; Take 5 mLs by mouth every 8 (eight) hours as needed for cough.  Dispense: 120 mL; Refill: 0 - azithromycin (ZITHROMAX) 250 MG tablet; Take 2 tabs PO x 1 dose, then 1 tab PO QD x 4 days  Dispense: 6 tablet; Refill: 0 - fluticasone (FLONASE) 50 MCG/ACT nasal spray; Place 2 sprays into both nostrils daily.  Dispense: 16 g; Refill: 6 - Supportive care encouraged: Rest, push fluids. RTC in 5-7 days if no improvement.   Marco Collie, PA-C  Primary Care at [facility] 11/25/2016 9:17 AM

## 2016-12-08 ENCOUNTER — Other Ambulatory Visit: Payer: Self-pay | Admitting: Orthopedic Surgery

## 2016-12-09 ENCOUNTER — Ambulatory Visit (INDEPENDENT_AMBULATORY_CARE_PROVIDER_SITE_OTHER): Payer: BLUE CROSS/BLUE SHIELD | Admitting: Physician Assistant

## 2016-12-09 VITALS — BP 167/108 | HR 85 | Temp 98.2°F | Resp 16 | Ht 71.0 in | Wt 296.0 lb

## 2016-12-09 DIAGNOSIS — R81 Glycosuria: Secondary | ICD-10-CM

## 2016-12-09 DIAGNOSIS — R350 Frequency of micturition: Secondary | ICD-10-CM | POA: Diagnosis not present

## 2016-12-09 DIAGNOSIS — E119 Type 2 diabetes mellitus without complications: Secondary | ICD-10-CM | POA: Diagnosis not present

## 2016-12-09 LAB — POC MICROSCOPIC URINALYSIS (UMFC): Mucus: ABSENT

## 2016-12-09 LAB — POCT URINALYSIS DIP (MANUAL ENTRY)
BILIRUBIN UA: NEGATIVE
BILIRUBIN UA: NEGATIVE mg/dL
LEUKOCYTES UA: NEGATIVE
NITRITE UA: NEGATIVE
PH UA: 7 (ref 5.0–8.0)
Protein Ur, POC: NEGATIVE mg/dL
RBC UA: NEGATIVE
SPEC GRAV UA: 1.015 (ref 1.010–1.025)
UROBILINOGEN UA: 0.2 U/dL

## 2016-12-09 LAB — GLUCOSE, POCT (MANUAL RESULT ENTRY)

## 2016-12-09 LAB — POCT GLYCOSYLATED HEMOGLOBIN (HGB A1C): Hemoglobin A1C: 10.8

## 2016-12-09 MED ORDER — GLIPIZIDE 5 MG PO TABS: 5.0000 mg | ORAL_TABLET | Freq: Two times a day (BID) | ORAL | 3 refills | Status: DC

## 2016-12-09 MED ORDER — METFORMIN HCL 500 MG PO TABS: 500.0000 mg | ORAL_TABLET | Freq: Two times a day (BID) | ORAL | 3 refills | Status: DC

## 2016-12-09 NOTE — Patient Instructions (Addendum)
  Lose at least 10 lbs in the next month but I would love to see 20 lbs.   Take metformin 1 in the morning and one at night for the next 7 days. Then increase this to two tabs in the morning and night.   Glipizide 5 mg should be taken in the morning.     WATCH THE SUGAR.    IF you received an x-ray today, you will receive an invoice from [facility]. Please contact [facility] at [phone] with questions or concerns regarding your invoice.   IF you received labwork today, you will receive an invoice from [place]. Please contact [facility] at [phone] with questions or concerns regarding your invoice.   Our billing staff will not be able to assist you with questions regarding bills from these companies.  You will be contacted with the lab results as soon as they are available. The fastest way to get your results is to activate your My Chart account. Instructions are located on the last page of this paperwork. If you have not heard from [place] regarding the results in 2 weeks, please contact this office.

## 2016-12-09 NOTE — Progress Notes (Signed)
12/09/2016 6:38 PM   DOB: January 21, 1971 / MRN: [id number]  SUBJECTIVE:  Trevor Garcia is a 46 y.o. male presenting for urinary frequency. This started about two weeks ago but really became problematic today and tells me he had to keep stopping on the road to pee. Tells me he has gained about 100 lbs in the last two years. He has a history of borderline diabetes in late 2016 and was started on metformin 500 mg at that time  He has No Known Allergies.   He  has a past medical history of Cataract (2011); Diabetes mellitus without complication ([place]); and Hypertension.    He  reports that he has never smoked. He has never used smokeless tobacco. He reports that he does not drink alcohol or use drugs. He  has no sexual activity history on file. The patient  has a past surgical history that includes Hammer toe surgery (Right, 2006) and Eye surgery (Bilateral).  His family history includes GER disease in his sister; Lupus in his mother.  Review of Systems  Constitutional: Negative for chills, diaphoresis and fever.  Respiratory: Negative for shortness of breath.   Cardiovascular: Negative for chest pain, orthopnea and leg swelling.  Gastrointestinal: Negative for abdominal pain and nausea.  Genitourinary: Negative for dysuria, flank pain, frequency, hematuria and urgency.  Skin: Negative for rash.  Neurological: Negative for dizziness.    The problem list and medications were reviewed and updated by myself where necessary and exist elsewhere in the encounter.   OBJECTIVE:  BP (!) 167/108   Pulse 85   Temp 98.2 F (36.8 C) (Oral)   Resp 16   Ht _0  (1.803 m)   Wt 296 lb (134.3 kg)   SpO2 96%   BMI 41.28 kg/m   Physical Exam  Constitutional: He appears well-developed. He is active and cooperative.  Non-toxic appearance.  Cardiovascular: Normal rate, regular rhythm, S1 normal, S2 normal, normal heart sounds, intact distal pulses and normal pulses.  Exam reveals no gallop and no friction  rub.   No murmur heard. Pulmonary/Chest: Effort normal. No stridor. No tachypnea. No respiratory distress. He has no wheezes. He has no rales.  Abdominal: He exhibits no distension.  Musculoskeletal: He exhibits no edema.  Neurological: He is alert.  Skin: Skin is warm and dry. He is not diaphoretic. No pallor.  Vitals reviewed.   Results for orders placed or performed in visit on 12/09/16 (from the past 72 hour(s))  POCT urinalysis dipstick     Status: Abnormal   Collection Time: 12/09/16  5:42 PM  Result Value Ref Range   Color, UA yellow yellow   Clarity, UA clear clear   Glucose, UA >=1,000 (A) negative mg/dL   Bilirubin, UA negative negative   Ketones, POC UA negative negative mg/dL   Spec Grav, UA 1.015 1.010 - 1.025   Blood, UA negative negative   pH, UA 7.0 5.0 - 8.0   Protein Ur, POC negative negative mg/dL   Urobilinogen, UA 0.2 0.2 or 1.0 E.U./dL   Nitrite, UA Negative Negative   Leukocytes, UA Negative Negative  POCT Microscopic Urinalysis ([facility])     Status: Abnormal   Collection Time: 12/09/16  5:51 PM  Result Value Ref Range   WBC,UR,HPF,POC None None WBC/hpf   RBC,UR,HPF,POC None None RBC/hpf   Bacteria None None, Too numerous to count   Mucus Absent Absent   Epithelial Cells, UR Per Microscopy Few (A) None, Too numerous to count cells/hpf  POCT glycosylated  hemoglobin (Hb A1C)     Status: None   Collection Time: 12/09/16  6:24 PM  Result Value Ref Range   Hemoglobin A1C 10.8   POCT glucose (manual entry)     Status: None   Collection Time: 12/09/16  6:26 PM  Result Value Ref Range   POC Glucose HHH 70 - 99 mg/dl   ASSESSMENT AND PLAN:  Trevor Garcia was seen today for urinary frequency.  Diagnoses and all orders for this visit:  Urinary frequency -     POCT Microscopic Urinalysis ([facility]) -     POCT urinalysis dipstick  Glucose found in urine on examination -     POCT glycosylated hemoglobin (Hb A1C) -     CMP14+EGFR -     CBC -     POCT glucose (manual  entry)  Type 2 diabetes mellitus without complication, without long-term current use of insulin ([place]): Sugar to high to read hear.  He was given 10 units of Novolog now.  I have given him a a meter.  If he is greater than 500 tonight I will call him.  He will also go ahead and start glipizide 5 mg in the morning, and start metformin 1000 mg daily for the next 7 days, then go to move to 2000 mg daily. I will see him back in one month.     The patient is advised to call or return to clinic if he does not see an improvement in symptoms, or to seek the care of the closest emergency department if he worsens with the above plan.   Philis Fendt, MHS, PA-C [facility] 12/09/2016 6:38 PM

## 2016-12-10 LAB — BASIC METABOLIC PANEL
BUN / CREAT RATIO: 19 (ref 9–20)
BUN: 16 mg/dL (ref 6–24)
CALCIUM: 9.4 mg/dL (ref 8.7–10.2)
CHLORIDE: 93 mmol/L — AB (ref 96–106)
CO2: 25 mmol/L (ref 18–29)
Creatinine, Ser: 0.85 mg/dL (ref 0.76–1.27)
GFR calc non Af Amer: 104 mL/min/{1.73_m2} (ref >59)
GFR, EST AFRICAN AMERICAN: 120 mL/min/{1.73_m2} (ref >59)
Glucose: 557 mg/dL (ref 65–99)
Potassium: 4.8 mmol/L (ref 3.5–5.2)
Sodium: 130 mmol/L — ABNORMAL LOW (ref 134–144)

## 2016-12-10 LAB — CBC
Hematocrit: 46.1 % (ref 37.5–51.0)
Hemoglobin: 15.5 g/dL (ref 13.0–17.7)
MCH: 27.9 pg (ref 26.6–33.0)
MCHC: 33.6 g/dL (ref 31.5–35.7)
MCV: 83 fL (ref 79–97)
PLATELETS: 225 10*3/uL (ref 150–379)
RBC: 5.56 x10E6/uL (ref 4.14–5.80)
RDW: 14.9 % (ref 12.3–15.4)
WBC: 10.4 10*3/uL (ref 3.4–10.8)

## 2016-12-10 NOTE — Progress Notes (Signed)
Spoke with patient regarding elevated glucose.  Patient tells me he feels well, is now taking glipizide and metformin, and is no longer having urinary frequency.  Advised he stay the course and follow up in one month. Deliah Boston, MS, PA-C 2:17 PM, 12/10/2016

## 2016-12-10 NOTE — Pre-Procedure Instructions (Addendum)
Trevor Garcia  12/10/2016      [id number] [place], [place] - [address] [place] [address] Phone: [phone] Fax: [phone]    Your procedure is scheduled on Apr 26 at 130 PM.  Report to [facility] Admitting at [place] AM.  Call this number if you have problems the morning of surgery:  [phone]   Remember:  Do not eat food or drink liquids after midnight.  Take these medicines the morning of surgery with A SIP OF WATER (none).   Take all other medications as prescribed except 7 days prior to surgery STOP taking any Aspirin, Aleve, Naproxen, Ibuprofen, Motrin, Advil, Goody's, BC's, all herbal medications, fish oil, and all vitamins   Do not wear jewelry, make-up or nail polish.  Do not wear lotions, powders, or perfumes, or deoderant.  Do not shave 48 hours prior to surgery.  Men may shave face and neck.  Do not bring valuables to the hospital.  [facility] is not responsible for any belongings or valuables.  Contacts, dentures or bridgework may not be worn into surgery.  Leave your suitcase in the car.  After surgery it may be brought to your room.  For patients admitted to the hospital, discharge time will be determined by your treatment team.  Patients discharged the day of surgery will not be allowed to drive home.    [place]- Preparing For Surgery  Before surgery, you can play an important role. Because skin is not sterile, your skin needs to be as free of germs as possible. You can reduce the number of germs on your skin by washing with CHG (chlorahexidine gluconate) Soap before surgery.  CHG is an antiseptic cleaner which kills germs and bonds with the skin to continue killing germs even after washing.  Please do not use if you have an allergy to CHG or antibacterial soaps. If your skin becomes reddened/irritated stop using the CHG.  Do not shave (including legs and underarms) for at least 48 hours  prior to first CHG shower. It is OK to shave your face.  Please follow these instructions carefully.   1. Shower the NIGHT BEFORE SURGERY and the MORNING OF SURGERY with CHG.   2. If you chose to wash your hair, wash your hair first as usual with your normal shampoo.  3. After you shampoo, rinse your hair and body thoroughly to remove the shampoo.  4. Use CHG as you would any other liquid soap. You can apply CHG directly to the skin and wash gently with a scrungie or a clean washcloth.   5. Apply the CHG Soap to your body ONLY FROM THE NECK DOWN.  Do not use on open wounds or open sores. Avoid contact with your eyes, ears, mouth and genitals (private parts). Wash genitals (private parts) with your normal soap.  6. Wash thoroughly, paying special attention to the area where your surgery will be performed.  7. Thoroughly rinse your body with warm water from the neck down.  8. DO NOT shower/wash with your normal soap after using and rinsing off the CHG Soap.  9. Pat yourself dry with a CLEAN TOWEL.   10. Wear CLEAN PAJAMAS   11. Place CLEAN SHEETS on your bed the night of your first shower and DO NOT SLEEP WITH PETS.    Day of Surgery: Do not apply any deodorants/lotions. Please wear clean clothes to the hospital/surgery center.  How to Manage Your Diabetes Before and After Surgery  Why is it important to control my blood sugar before and after surgery? . Improving blood sugar levels before and after surgery helps healing and can limit problems. . A way of improving blood sugar control is eating a healthy diet by: o  Eating less sugar and carbohydrates o  Increasing activity/exercise o  Talking with your doctor about reaching your blood sugar goals . High blood sugars (greater than 180 mg/dL) can raise your risk of infections and slow your recovery, so you will need to focus on controlling your diabetes during the weeks before surgery. . Make sure that the doctor who takes  care of your diabetes knows about your planned surgery including the date and location.  How do I manage my blood sugar before surgery? . Check your blood sugar at least 4 times a day, starting 2 days before surgery, to make sure that the level is not too high or low. o Check your blood sugar the morning of your surgery when you wake up and every 2 hours until you get to the Short Stay unit. . If your blood sugar is less than 70 mg/dL, you will need to treat for low blood sugar: o Do not take insulin. o Treat a low blood sugar (less than 70 mg/dL) with  cup of clear juice (cranberry or apple), 4 glucose tablets, OR glucose gel. o Recheck blood sugar in 15 minutes after treatment (to make sure it is greater than 70 mg/dL). If your blood sugar is not greater than 70 mg/dL on recheck, call [phone] for further instructions. . Report your blood sugar to the short stay nurse when you get to Short Stay.  . If you are admitted to the hospital after surgery: o Your blood sugar will be checked by the staff and you will probably be given insulin after surgery (instead of oral diabetes medicines) to make sure you have good blood sugar levels. o The goal for blood sugar control after surgery is 80-180 mg/dL.      WHAT DO I DO ABOUT MY DIABETES MEDICATION?   Marland Kitchen Do not take oral diabetes medicines (pills) the morning of surgery.   Reviewed and Endorsed by [facility] Patient Education Committee, August 2015 Special instructions:    Please read over the following fact sheets that you were given. Pain Booklet, Coughing and Deep Breathing and Surgical Site Infection Prevention

## 2016-12-13 ENCOUNTER — Encounter (HOSPITAL_COMMUNITY): Payer: Self-pay | Admitting: Vascular Surgery

## 2016-12-13 ENCOUNTER — Encounter (HOSPITAL_COMMUNITY)
Admission: RE | Admit: 2016-12-13 | Discharge: 2016-12-13 | Disposition: A | Discharge status: Home / Self Care | Payer: Worker's Compensation | Source: Ambulatory Visit | Attending: Orthopedic Surgery | Admitting: Orthopedic Surgery

## 2016-12-13 ENCOUNTER — Encounter (HOSPITAL_COMMUNITY): Payer: Self-pay

## 2016-12-13 DIAGNOSIS — Z833 Family history of diabetes mellitus: Secondary | ICD-10-CM | POA: Insufficient documentation

## 2016-12-13 DIAGNOSIS — Z01818 Encounter for other preprocedural examination: Secondary | ICD-10-CM | POA: Diagnosis present

## 2016-12-13 DIAGNOSIS — E669 Obesity, unspecified: Secondary | ICD-10-CM | POA: Insufficient documentation

## 2016-12-13 DIAGNOSIS — Z8249 Family history of ischemic heart disease and other diseases of the circulatory system: Secondary | ICD-10-CM | POA: Diagnosis not present

## 2016-12-13 DIAGNOSIS — K219 Gastro-esophageal reflux disease without esophagitis: Secondary | ICD-10-CM | POA: Diagnosis not present

## 2016-12-13 DIAGNOSIS — Z01812 Encounter for preprocedural laboratory examination: Secondary | ICD-10-CM | POA: Insufficient documentation

## 2016-12-13 DIAGNOSIS — H269 Unspecified cataract: Secondary | ICD-10-CM | POA: Insufficient documentation

## 2016-12-13 DIAGNOSIS — Z7984 Long term (current) use of oral hypoglycemic drugs: Secondary | ICD-10-CM | POA: Insufficient documentation

## 2016-12-13 DIAGNOSIS — Z01810 Encounter for preprocedural cardiovascular examination: Secondary | ICD-10-CM | POA: Diagnosis not present

## 2016-12-13 DIAGNOSIS — E119 Type 2 diabetes mellitus without complications: Secondary | ICD-10-CM | POA: Diagnosis not present

## 2016-12-13 DIAGNOSIS — I10 Essential (primary) hypertension: Secondary | ICD-10-CM | POA: Insufficient documentation

## 2016-12-13 DIAGNOSIS — Z6838 Body mass index (BMI) 38.0-38.9, adult: Secondary | ICD-10-CM | POA: Insufficient documentation

## 2016-12-13 HISTORY — DX: Gastro-esophageal reflux disease without esophagitis: K21.9

## 2016-12-13 HISTORY — DX: Major depressive disorder, single episode, unspecified: F32.9

## 2016-12-13 HISTORY — DX: Depression, unspecified: F32.A

## 2016-12-13 LAB — GLUCOSE, CAPILLARY: Glucose-Capillary: 435 mg/dL — ABNORMAL HIGH (ref 65–99)

## 2016-12-13 NOTE — Progress Notes (Addendum)
Anesthesia PAT Evaluation: Patient is a 46 year old male scheduled for right shoulder arthroscopy with rotator cuff repair on 12/16/16 by Dr. Ave Filter.  Patient evaluated at PAT due to non-fasting CBG of 435. (He reported eating and drinking last at 7:45 AM.) He otherwise feels in his usual state of health. Patient does not really have an eight hour period of fasting, so he can't really tell me what his fasting sugars run. 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 He was diagnosed with borderline DM2 ~ 07/2015 (A1c 6.5%) and was started on metformin. He eventually did not get the metformin refilled and presented on 12/09/16 with complaints of urinary frequency. He was seen by Deliah Boston, PA-C at Primary Care at [facility]. High POC glucose would not register (High) on the meter, but serum glucose was 557.  He was given Novolog 10 Units X 1 and started on glipizide and metformin. He was given a glucometer and referred for DM education (which is still pending). He is scheduled to see Deliah Boston in one month for DM follow-up.  History includes never smoker, HTN (no meds), DM2, depression (situational following shoulder injury), GERD, cataract '11. BMI is consistent with obesity. (Reportedly gained about 100 lbs in the past two years--50 of which occurred after his shoulder injury when he was out of work.)  Meds include glipizide 5 mg 2 tabs Q AM, metformin 500 mg 1 tab BID X 7 days (started 12/09/16) then increase to 2 tab BID.  BP (!) 162/87   Pulse 90   Temp 37.4 C   Resp 18   Ht  (1.854 m)   Wt 291 lb (132 kg)   SpO2 95%   BMI 38.39 kg/m  Previous BP 11/25/16 163/93, 150/100 on 11/25/16 and 167/108 on 12/09/16. He is a pleasant male in NAD. Heart RRR. No murmur noted. Lungs clear. No ankle edema. He denied chest pain, SOB, edema, syncope, palpitations. 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 He denied CV symptoms with this level of activity. His maternal grandfather has a history of DM and CAD. He does not know much about his father's medical history.   EKG 12/13/16: NSR, non-specific ST/T wave abnormality (negative T wave aVL, flat to slightly negative I). He has QR or rSR patterin in III and aVF. High lateral T wave abnormality is less pronounced when compared to 08/13/15 tracing.   Labs from 12/09/16 noted. Na 130, glucose 557 (addressed at PCP visit), Cr 0.85. CO2 25. CBC WNL. A1c 10.8 consistent with average glucose of 263.    Overall, I think his EKG is stable when compared to 2016 tracing. He is active at work and denied any CV symptoms. His BP was elevated today, but not as much as when he was seen at [facility] at [facility]. Will defer treatment recommendations to his PCP. In regards to his hyperglycemia, he just started back on DM medications about 3-4 days ago. He seems motivated to get it better controlled and is anxious to meet with a DM educator. I spoke with Deliah Boston, PA-C, he is in agreement that if surgery is elective, it would be best to postpone until after his one month follow-up appointment to reassess success of medication and lifestyle modifications. He can also re-evaluate BP at that visit.  If surgery needed sooner then he may need to start patient on insulin therapy. Anesthesiologist Dr. Michelle Piper also in agreement to postpone elective surgery until DM better controlled. I reviewed this with patient. I also discussed with Olegario Messier at Dr. Veda Canning office. She will review with Dr. Ave Filter and let patient know if officially canceled or rescheduled.   Velna Ochs [facility] Short Stay Center/Anesthesiology Phone [phone] 12/13/2016 3:19 PM  Addendum: Since case remained as scheduled despite initial PCP recommendations, I contacted Carla at Dr. Veda Canning office. Patient apparently called  this morning and reported a glucose of "200." Dr. Ave Filter was comfortable proceeding with surgery as scheduled if patient's glucose remained around this level or better. I discussed with anesthesiologist Dr. Chilton Si who wanted additional PCP input. I sent a staff message to Deliah Boston indicating that patient's 12/13/16 glucose of 435 was not fasting (as initially thought by the PAT RN), that today's glucose was 200, and that Dr. Ave Filter wished to proceed with surgery. Discussed that patient would be at risk for cancellation if he arrived with fasting CBG > 200, which Mr. Chestine Spore felt was a reasonable cut point given that patient is medication naive and recently on glipizide and metformin. If fasting CBG ~ 200 or better on the day of surgery, he felt patient would be okay to proceed given age, activity tolerance, on-going DM treatment, and no known CAD.  Shonna Chock, PA-C [facility] Short Stay Center/Anesthesiology Phone [phone] 12/14/2016 6:00 PM

## 2016-12-13 NOTE — Progress Notes (Addendum)
Trevor Garcia was diagnosied with diabetes  4/19 18.  CBG was 455 this am on arrival to PAT.  Patient reports that he had eaten on the way, he had a 6 inch [place] burrito and fries from [employer]. Patient reports that the lowest CBG he has had in a week was 350 , it was  Fasting CBG. I called Trevor Garcia , Trevor Garcia who patient saw patient in the office last week. Trevor Garcia reports that patient has instruction to increase Metformin to 1000 mg 2 times a day after 7 days, but said to go ahead and increase it today. Clark, Trevor Garcia also said for patient to take Glipizide 2 times a day starting today.   I instructed patient to not take Glipizide the evening prior to surgery , but ok to take Metformin the morning of surgery. patient.  I also instructed to not take Glipizide the evening prior to surgery and  No diabetic medication the morning of surgery.  Trevor Garcia repeat instructions.n  Trevor Garcia, [place]  Saw patient and talked with Trevor Boston, Trevor Garcia.

## 2016-12-13 NOTE — Progress Notes (Signed)
   12/13/16 0842  OBSTRUCTIVE SLEEP APNEA  Have you ever been diagnosed with sleep apnea through a sleep study? No  Do you snore loudly (loud enough to be heard through closed doors)?  0  Do you often feel tired, fatigued, or sleepy during the daytime (such as falling asleep during driving or talking to someone)? 0  Has anyone observed you stop breathing during your sleep? 0  Do you have, or are you being treated for high blood pressure? 1  BMI more than 35 kg/m2? 1  Age > 50 (1-yes) 0  Neck circumference greater than:Male 16 inches or larger, Male 17inches or larger? 1 (18.5)  Male Gender (Yes=1) 1  Obstructive Sleep Apnea Score 4  Score 5 or greater  Results sent to PCP

## 2016-12-14 ENCOUNTER — Telehealth: Payer: Self-pay | Admitting: Family Medicine

## 2016-12-14 ENCOUNTER — Telehealth: Payer: Self-pay | Admitting: Physician Assistant

## 2016-12-14 NOTE — Telephone Encounter (Signed)
Pt is needing to get an rx called in for lancets and test strips on the contour meter Clark gave him at visit   Best number [phone]

## 2016-12-14 NOTE — Telephone Encounter (Signed)
Trevor Garcia (PT) WANTED YOU TO CALL HER [phone] THE SURGEON FOR THE ROTATOR CUFF REPAIR WANT TO DO SURGERY ON 12-17-15 HE WANT TO STILL CONTINUE THE SURGERY EVEN THOUGH PATIENT BLOOD SUGAR READING IS ELEVATED AND PATIENT IS DIABETIC THE PHYSICAL THERPHIST TOLD HIM THAT YOU WANT TO POSTPONE SURGERY FOR A MONTH PLEASE CALL ALLISON (PT) [phone]  SHE WANT TO SEE IF YOU CAN WRITE SOMETHING THAT PT WANT PAST CLEARANCE FOR SURGERY

## 2016-12-15 ENCOUNTER — Telehealth: Payer: Self-pay | Admitting: Emergency Medicine

## 2016-12-15 MED ORDER — LANCETS 30G MISC: 1 refills | Status: DC

## 2016-12-15 MED ORDER — GLUCOSE BLOOD VI STRP: ORAL_STRIP | 1 refills | Status: DC

## 2016-12-15 NOTE — Telephone Encounter (Signed)
We have communicated via epic already.  Thank you Angie.

## 2016-12-15 NOTE — Telephone Encounter (Signed)
done

## 2016-12-15 NOTE — Telephone Encounter (Signed)
Pt informed [facility]

## 2016-12-16 ENCOUNTER — Encounter (HOSPITAL_COMMUNITY): Admission: RE | Payer: Self-pay | Source: Ambulatory Visit

## 2016-12-16 ENCOUNTER — Ambulatory Visit (HOSPITAL_COMMUNITY)
Admission: RE | Admit: 2016-12-16 | Payer: Worker's Compensation | Source: Ambulatory Visit | Admitting: Orthopedic Surgery

## 2016-12-16 SURGERY — SHOULDER ARTHROSCOPY WITH ROTATOR CUFF REPAIR AND SUBACROMIAL DECOMPRESSION
Anesthesia: Choice | Laterality: Right

## 2016-12-16 NOTE — Telephone Encounter (Signed)
[facility] strips and lancets  Gave verbal ok  Wife advised

## 2016-12-20 ENCOUNTER — Ambulatory Visit (INDEPENDENT_AMBULATORY_CARE_PROVIDER_SITE_OTHER): Payer: BLUE CROSS/BLUE SHIELD | Admitting: Physician Assistant

## 2016-12-20 ENCOUNTER — Encounter: Payer: Self-pay | Admitting: Physician Assistant

## 2016-12-20 VITALS — BP 152/100 | HR 72 | Temp 98.7°F | Resp 17 | Ht 73.0 in | Wt 290.0 lb

## 2016-12-20 DIAGNOSIS — I10 Essential (primary) hypertension: Secondary | ICD-10-CM

## 2016-12-20 DIAGNOSIS — Z23 Encounter for immunization: Secondary | ICD-10-CM | POA: Diagnosis not present

## 2016-12-20 DIAGNOSIS — E119 Type 2 diabetes mellitus without complications: Secondary | ICD-10-CM

## 2016-12-20 LAB — POCT URINALYSIS DIP (MANUAL ENTRY)
Bilirubin, UA: NEGATIVE
Blood, UA: NEGATIVE
GLUCOSE UA: NEGATIVE mg/dL
Ketones, POC UA: NEGATIVE mg/dL
Leukocytes, UA: NEGATIVE
Nitrite, UA: NEGATIVE
Protein Ur, POC: NEGATIVE mg/dL
Spec Grav, UA: 1.025 (ref 1.010–1.025)
UROBILINOGEN UA: 0.2 U/dL
pH, UA: 6 (ref 5.0–8.0)

## 2016-12-20 LAB — LIPID PANEL
CHOLESTEROL TOTAL: 192 mg/dL (ref 100–199)
Chol/HDL Ratio: 6.2 ratio — ABNORMAL HIGH (ref 0.0–5.0)
HDL: 31 mg/dL — ABNORMAL LOW (ref >39)
LDL CALC: 127 mg/dL — AB (ref 0–99)
TRIGLYCERIDES: 168 mg/dL — AB (ref 0–149)
VLDL CHOLESTEROL CAL: 34 mg/dL (ref 5–40)

## 2016-12-20 LAB — GLUCOSE, POCT (MANUAL RESULT ENTRY): POC Glucose: 136 mg/dl — AB (ref 70–99)

## 2016-12-20 MED ORDER — AMLODIPINE BESYLATE 5 MG PO TABS: 5.0000 mg | ORAL_TABLET | Freq: Every day | ORAL | 3 refills | Status: DC

## 2016-12-20 MED ORDER — METFORMIN HCL 500 MG PO TABS: 1000.0000 mg | ORAL_TABLET | Freq: Two times a day (BID) | ORAL | 3 refills | Status: DC

## 2016-12-20 MED ORDER — GLIPIZIDE 5 MG PO TABS: 5.0000 mg | ORAL_TABLET | Freq: Every day | ORAL | 3 refills | Status: DC

## 2016-12-20 MED ORDER — LORATADINE 10 MG PO TABS: 10.0000 mg | ORAL_TABLET | Freq: Every day | ORAL | 11 refills | Status: DC

## 2016-12-20 NOTE — Patient Instructions (Signed)
     IF you received an x-ray today, you will receive an invoice from [facility]. Please contact [facility] at [phone] with questions or concerns regarding your invoice.   IF you received labwork today, you will receive an invoice from [facility]. Please contact [facility] at [phone] with questions or concerns regarding your invoice.   Our billing staff will not be able to assist you with questions regarding bills from these companies.  You will be contacted with the lab results as soon as they are available. The fastest way to get your results is to activate your My Chart account. Instructions are located on the last page of this paperwork. If you have not heard from us regarding the results in 2 weeks, please contact this office.     

## 2016-12-20 NOTE — Progress Notes (Signed)
12/20/2016 8:55 AM   DOB: [birth year] / MRN: [id number]  SUBJECTIVE:  Trevor Garcia is a 46 y.o. male presenting for recheck of diabetes. Was seen by me roughly 2 weeks ago and found to have 2000 of sugar in his urine along with frequent urination.  A1c came back near 10 at that time.  He was started on glipizide and metformin at that time and was schedule for shoulder arthroscopy, however this was canceled due to a fasting sugar of 435.  The surgical team contacted me advising it was unsafe to proceed with that level of hyperglycemia.    He is taking glipizide 5 mg BID.  He is also taking metformin 500 mg BID.    Has been having some throat burning and itching and cough. Has some sneezing, eye itching.     Immunization History  Administered Date(s) Administered  . Tdap 09/11/2015     He has No Known Allergies.   He  has a past medical history of Cataract (2011); Depression; Diabetes mellitus without complication (HCC); GERD (gastroesophageal reflux disease); and Hypertension.    He  reports that he has never smoked. He has never used smokeless tobacco. He reports that he does not drink alcohol or use drugs. He  has no sexual activity history on file. The patient  has a past surgical history that includes Hammer toe surgery (Right, 2006); Eye surgery (Bilateral); and cyst removed left arm (Left).  His family history includes GER disease in his sister; Lupus in his mother.  Review of Systems  Constitutional: Negative for diaphoresis.  Eyes: Negative.   Respiratory: Negative for shortness of breath.   Cardiovascular: Negative for chest pain, orthopnea and leg swelling.  Gastrointestinal: Negative for nausea.  Genitourinary: Negative for frequency and urgency.  Neurological: Negative for dizziness.  Endo/Heme/Allergies: Negative for polydipsia.    The problem list and medications were reviewed and updated by myself where necessary and exist elsewhere in the encounter.    OBJECTIVE:  BP (!) 158/108 (BP Location: Right Arm, Patient Position: Sitting, Cuff Size: Large)   Pulse 72   Temp 98.7 F (37.1 C) (Oral)   Resp 17   Ht  (1.854 m)   Wt 290 lb (131.5 kg)   SpO2 93%   BMI 38.26 kg/m   Physical Exam  Constitutional: He appears well-developed. He is active and cooperative.  Non-toxic appearance.  Cardiovascular: Normal rate, regular rhythm, S1 normal, S2 normal, normal heart sounds, intact distal pulses and normal pulses.  Exam reveals no gallop and no friction rub.   No murmur heard. Pulmonary/Chest: Effort normal. No stridor. No tachypnea. No respiratory distress. He has no wheezes. He has no rales.  Abdominal: He exhibits no distension.  Musculoskeletal: He exhibits no edema.  Neurological: He is alert.  Skin: Skin is warm and dry. He is not diaphoretic. No pallor.  Vitals reviewed.  Wt Readings from Last 3 Encounters:  12/20/16 290 lb (131.5 kg)  12/13/16 291 lb (132 kg)  12/09/16 296 lb (134.3 kg)     Results for orders placed or performed in visit on 12/20/16 (from the past 72 hour(s))  POCT glucose (manual entry)     Status: Abnormal   Collection Time: 12/20/16  8:29 AM  Result Value Ref Range   POC Glucose 136 (A) 70 - 99 mg/dl  POCT urinalysis dipstick     Status: None   Collection Time: 12/20/16  8:45 AM  Result Value Ref Range   Color, UA yellow  yellow   Clarity, UA clear clear   Glucose, UA negative negative mg/dL   Bilirubin, UA negative negative   Ketones, POC UA negative negative mg/dL   Spec Grav, UA 8.295 6.213 - 1.025   Blood, UA negative negative   pH, UA 6.0 5.0 - 8.0   Protein Ur, POC negative negative mg/dL   Urobilinogen, UA 0.2 0.2 or 1.0 E.U./dL   Nitrite, UA Negative Negative   Leukocytes, UA Negative Negative   BP Readings from Last 3 Encounters:  12/20/16 (!) 158/108  12/13/16 (!) 162/87  12/09/16 (!) 167/108     No results found.  ASSESSMENT AND PLAN:  Don was seen today for  follow-up.  Diagnoses and all orders for this visit:  Type 2 diabetes mellitus without complication, without long-term current use of insulin (HCC): Drastically improved glucose.  Metformin to 2 grams daily and glipizide to 5 mg qam.   -     POCT glucose (manual entry) -     POCT urinalysis dipstick -     metFORMIN (GLUCOPHAGE) 500 MG tablet; Take 2 tablets (1,000 mg total) by mouth 2 (two) times daily with a meal. -     glipiZIDE (GLUCOTROL) 5 MG tablet; Take 1 tablet (5 mg total) by mouth daily before breakfast.  Essential HTN: Narovasc five.  Will see him back on the 14th.    The patient is advised to call or return to clinic if he does not see an improvement in symptoms, or to seek the care of the closest emergency department if he worsens with the above plan.   Deliah Boston, MHS, PA-C [facility] 12/20/2016 8:55 AM

## 2016-12-21 ENCOUNTER — Encounter: Payer: BLUE CROSS/BLUE SHIELD | Attending: Physician Assistant | Admitting: *Deleted

## 2016-12-21 DIAGNOSIS — Z6838 Body mass index (BMI) 38.0-38.9, adult: Secondary | ICD-10-CM | POA: Diagnosis not present

## 2016-12-21 DIAGNOSIS — Z713 Dietary counseling and surveillance: Secondary | ICD-10-CM | POA: Insufficient documentation

## 2016-12-21 DIAGNOSIS — E119 Type 2 diabetes mellitus without complications: Secondary | ICD-10-CM | POA: Insufficient documentation

## 2016-12-21 NOTE — Progress Notes (Signed)
He does not want to start a statin this time.  Will push him harder in the future visits.

## 2016-12-21 NOTE — Progress Notes (Signed)

## 2016-12-23 ENCOUNTER — Telehealth: Payer: Self-pay | Admitting: Physician Assistant

## 2016-12-23 NOTE — Telephone Encounter (Signed)
Carla with [facility] is calling to confirm that the patient is cleared for surgery so that it can be rescheduled she faxed a request but has not gotten it return yet and patient states that he was seen and cleared by Deliah Bostonmichael clark   Best number is [phone] Albin FellingCarla -[facility]

## 2016-12-23 NOTE — Telephone Encounter (Signed)
I know he has been seen 12/20/16, was he cleared?

## 2016-12-23 NOTE — Telephone Encounter (Signed)
He is medication compliant and his CBG was greatly improved when I saw him last.  If Ms. Hannah BeatZelenak feels that he is ready to proceed then I feel that he is safe. Deliah BostonMichael Clark, MS, PA-C 3:31 PM, 12/23/2016

## 2016-12-27 ENCOUNTER — Other Ambulatory Visit: Payer: Self-pay | Admitting: Orthopedic Surgery

## 2016-12-28 ENCOUNTER — Encounter: Payer: BLUE CROSS/BLUE SHIELD | Admitting: *Deleted

## 2016-12-28 ENCOUNTER — Ambulatory Visit: Payer: Self-pay

## 2016-12-28 DIAGNOSIS — E119 Type 2 diabetes mellitus without complications: Secondary | ICD-10-CM | POA: Diagnosis not present

## 2016-12-28 NOTE — Progress Notes (Signed)

## 2017-01-03 ENCOUNTER — Encounter (HOSPITAL_COMMUNITY)
Admission: RE | Admit: 2017-01-03 | Discharge: 2017-01-03 | Disposition: A | Discharge status: Home / Self Care | Payer: Worker's Compensation | Source: Ambulatory Visit | Attending: Orthopedic Surgery | Admitting: Orthopedic Surgery

## 2017-01-03 ENCOUNTER — Ambulatory Visit: Payer: BLUE CROSS/BLUE SHIELD | Admitting: Physician Assistant

## 2017-01-03 ENCOUNTER — Encounter (HOSPITAL_COMMUNITY): Payer: Self-pay

## 2017-01-03 DIAGNOSIS — S43491A Other sprain of right shoulder joint, initial encounter: Secondary | ICD-10-CM | POA: Diagnosis not present

## 2017-01-03 DIAGNOSIS — M25511 Pain in right shoulder: Secondary | ICD-10-CM | POA: Diagnosis present

## 2017-01-03 DIAGNOSIS — M75101 Unspecified rotator cuff tear or rupture of right shoulder, not specified as traumatic: Secondary | ICD-10-CM | POA: Diagnosis not present

## 2017-01-03 DIAGNOSIS — Z6841 Body Mass Index (BMI) 40.0 and over, adult: Secondary | ICD-10-CM | POA: Diagnosis not present

## 2017-01-03 DIAGNOSIS — I10 Essential (primary) hypertension: Secondary | ICD-10-CM | POA: Diagnosis not present

## 2017-01-03 DIAGNOSIS — Z79899 Other long term (current) drug therapy: Secondary | ICD-10-CM | POA: Diagnosis not present

## 2017-01-03 DIAGNOSIS — Z7984 Long term (current) use of oral hypoglycemic drugs: Secondary | ICD-10-CM | POA: Diagnosis not present

## 2017-01-03 DIAGNOSIS — E119 Type 2 diabetes mellitus without complications: Secondary | ICD-10-CM | POA: Diagnosis not present

## 2017-01-03 DIAGNOSIS — X58XXXA Exposure to other specified factors, initial encounter: Secondary | ICD-10-CM | POA: Diagnosis not present

## 2017-01-03 HISTORY — DX: Other seasonal allergic rhinitis: J30.2

## 2017-01-03 LAB — COMPREHENSIVE METABOLIC PANEL
ALK PHOS: 78 U/L (ref 38–126)
ALT: 119 U/L — AB (ref 17–63)
ANION GAP: 12 (ref 5–15)
AST: 112 U/L — ABNORMAL HIGH (ref 15–41)
Albumin: 3.8 g/dL (ref 3.5–5.0)
BILIRUBIN TOTAL: 0.7 mg/dL (ref 0.3–1.2)
BUN: 10 mg/dL (ref 6–20)
CALCIUM: 9 mg/dL (ref 8.9–10.3)
CO2: 21 mmol/L — ABNORMAL LOW (ref 22–32)
CREATININE: 0.87 mg/dL (ref 0.61–1.24)
Chloride: 104 mmol/L (ref 101–111)
Glucose, Bld: 136 mg/dL — ABNORMAL HIGH (ref 65–99)
Potassium: 4.1 mmol/L (ref 3.5–5.1)
SODIUM: 137 mmol/L (ref 135–145)
TOTAL PROTEIN: 7.4 g/dL (ref 6.5–8.1)

## 2017-01-03 LAB — CBC
HCT: 48.2 % (ref 39.0–52.0)
HEMOGLOBIN: 16.1 g/dL (ref 13.0–17.0)
MCH: 28 pg (ref 26.0–34.0)
MCHC: 33.4 g/dL (ref 30.0–36.0)
MCV: 84 fL (ref 78.0–100.0)
PLATELETS: 249 10*3/uL (ref 150–400)
RBC: 5.74 MIL/uL (ref 4.22–5.81)
RDW: 15 % (ref 11.5–15.5)
WBC: 10 10*3/uL (ref 4.0–10.5)

## 2017-01-03 LAB — GLUCOSE, CAPILLARY: GLUCOSE-CAPILLARY: 123 mg/dL — AB (ref 65–99)

## 2017-01-03 NOTE — Progress Notes (Addendum)
Anesthesia Chart Review: Patient is a 46 year old male scheduled for right shoulder arthroscopy with rotator cuff repair on 01/06/17 by Dr. Ave Filter. Procedure was initially scheduled for 12/16/16, but was postponed due to need for better DM control. (See my anesthesia note from 12/13/16 with addendums.) Since then patient has been re-evaluated by his PCP Deliah Boston, PA-C ([facility]). He is taking metformin 1000 mg BID and glipizide 5 mg daily. Follow-up glucose readings were 123 and 136 (down from 557 and CBG 435). He has also seen a dietician and was started on amlodipine for HTN.   History includes never smoker, HTN (no meds), DM2, depression (situational following shoulder injury), GERD, cataract '11. BMI is consistent with obesity. (Reportedly gained about 100 lbs in the past two years--50 of which occurred after his shoulder injury when he was out of work.) Abdominal ultrasound in 2008 showed evidence of fatty liver.    Meds include glipizide 5 mg Q AM, metformin 1000 mg BID, amlodipine 5 mg daily (patient taking PRN), Claritin.   When I evaluated him on 12/13/16, he denied chest pain, SOB, edema, syncope, palpitations. 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 He denied CV symptoms with this level of activity. His maternal grandfather has a history of DM and CAD. He does not know much about his father's medical history.   EKG 12/13/16: NSR, non-specific ST/T wave abnormality (negative T wave aVL, flat to slightly negative I). He has QR or rSR patterin in III and aVF. High lateral T wave abnormality is less pronounced when compared to 08/20/2015 tracing. (EKG was reviewed with Dr. Michelle Piper and Deliah Boston, PA-C following patient's April PAT visit. At that time Deliah Boston, PA-C felt if fasting glucose result acceptable then patient would be okay to proceed given age, activity tolerance, on-going DM treatment, and no known  CAD.)  Patient came in to PAT this morning for a lab only visit. A CMET was done due to history of elevated LFTs in the past. Results showed further increase of AST to 112 (59 08/2015 and 54 07/2015) and ALT to 119 (previously 89 08/2015, 80 07/2015). Cr 0.87, glucose 136. CBC WNL. (Last A1c prior to being started on DM meds was 10.8 on 12/09/16.) Reviewed labs with anesthesiologist Dr. Noreene Larsson. Repeat HFP and check coags on the morning of surgery. If labs and BP are acceptable then would anticipate that he can proceed as planned. I will forward CMET results to Deliah Boston, PA-C for follow-up purposes.  Velna Ochs [facility] Short Stay Center/Anesthesiology Phone [phone] 01/03/2017 4:27 PM  Addendum: I called and spoke with patient to review labs (LFTs) and discuss need for continued follow-up and that we would recheck on the day of surgery to ensure stability. He denied ETOH use or known hepatitis history. He has been taking Aleve in the recent past. Fatty liver is likely a contributor. He denied abdominal pain and N/V. His A1c is now down to 10 (but only a month of that reflects him being on DM treatment). He reports his his fasting CBGs have been 110-120's and post prandial 160-170's. He has been taking amlodipine PRN elevated BP, although currently he does not have a home BP cuff. He reported last BP 149/79. Readings recorded during 12/20/16 visit listed 158/108 on 12/20/16, 162/87 on 12/13/16, and 167/108 12/09/16. Discussed that if he arrived with a significantly elevated BP on the day of surgery, his surgery  could get delayed or cancelled. He will take amlodipine as prescribed at least through his surgery date and follow-up with Deliah BostonMichael Clark on 01/10/17 as already scheduled.  Velna Ochsllison Vanette Noguchi, PA-C [facility] Short [facility]/Anesthesiology Phone [phone] 01/04/2017 9:45 AM

## 2017-01-04 ENCOUNTER — Encounter: Payer: BLUE CROSS/BLUE SHIELD | Admitting: *Deleted

## 2017-01-04 ENCOUNTER — Ambulatory Visit: Payer: Self-pay

## 2017-01-04 DIAGNOSIS — E119 Type 2 diabetes mellitus without complications: Secondary | ICD-10-CM

## 2017-01-04 LAB — HEMOGLOBIN A1C
HEMOGLOBIN A1C: 10 % — AB (ref 4.8–5.6)
MEAN PLASMA GLUCOSE: 240 mg/dL

## 2017-01-04 NOTE — Progress Notes (Signed)
Patient was seen on 01/04/2017 for the third of a series of three diabetes self-management courses at the [facility].   Trevor Garcia. State the amount of activity recommended for healthy living . Describe activities suitable for individual needs . Identify ways to regularly incorporate activity into daily life . Identify barriers to activity and ways to over come these barriers  Identify diabetes medications being personally used and their primary action for lowering glucose and possible side effects . Describe role of stress on blood glucose and develop strategies to address psychosocial issues . Identify diabetes complications and ways to prevent them  Explain how to manage diabetes during illness . Evaluate success in meeting personal goal . Establish 2-3 goals that they will plan to diligently work on until they return for the  7651-month follow-up visit  Goals:   I will count my carb choices at most meals and snacks  I will be active walking and working out  3-4 times a week  Your patient has identified these potential barriers to change:  None stated  Your patient has identified their diabetes self-care support plan as  None stated  Plan:  Attend Support Group as desired

## 2017-01-05 NOTE — Anesthesia Preprocedure Evaluation (Addendum)
Anesthesia Evaluation  Patient identified by MRN, date of birth, ID band Patient awake    Reviewed: Allergy & Precautions, NPO status , Patient's Chart, lab work & pertinent test results  History of Anesthesia Complications Negative for: history of anesthetic complications  Airway Mallampati: III  TM Distance: >3 FB Neck ROM: Full    Dental no notable dental hx. (+) Dental Advisory Given   Pulmonary neg pulmonary ROS,    Pulmonary exam normal        Cardiovascular hypertension, Normal cardiovascular exam     Neuro/Psych PSYCHIATRIC DISORDERS Depression negative neurological ROS  negative psych ROS   GI/Hepatic Neg liver ROS, GERD  ,  Endo/Other  diabetesMorbid obesity  Renal/GU negative Renal ROS  negative genitourinary   Musculoskeletal negative musculoskeletal ROS (+)   Abdominal   Peds negative pediatric ROS (+)  Hematology negative hematology ROS (+)   Anesthesia Other Findings   Reproductive/Obstetrics negative OB ROS                            Anesthesia Physical Anesthesia Plan  ASA: III  Anesthesia Plan: General   Post-op Pain Management: GA combined w/ Regional for post-op pain   Induction: Intravenous  Airway Management Planned: Oral ETT  Additional Equipment:   Intra-op Plan:   Post-operative Plan: Extubation in OR  Informed Consent: I have reviewed the patients History and Physical, chart, labs and discussed the procedure including the risks, benefits and alternatives for the proposed anesthesia with the patient or authorized representative who has indicated his/her understanding and acceptance.   Dental advisory given  Plan Discussed with: CRNA, Anesthesiologist and Surgeon  Anesthesia Plan Comments:        Anesthesia Quick Evaluation

## 2017-01-06 ENCOUNTER — Encounter (HOSPITAL_COMMUNITY)
Admission: RE | Disposition: A | Discharge status: Home / Self Care | Payer: Self-pay | Source: Ambulatory Visit | Attending: Orthopedic Surgery

## 2017-01-06 ENCOUNTER — Ambulatory Visit (HOSPITAL_COMMUNITY): Payer: Worker's Compensation | Admitting: Vascular Surgery

## 2017-01-06 ENCOUNTER — Encounter (HOSPITAL_COMMUNITY): Payer: Self-pay | Admitting: *Deleted

## 2017-01-06 ENCOUNTER — Ambulatory Visit (HOSPITAL_COMMUNITY)
Admission: RE | Admit: 2017-01-06 | Discharge: 2017-01-06 | Disposition: A | Discharge status: Home / Self Care | Payer: Worker's Compensation | Source: Ambulatory Visit | Attending: Orthopedic Surgery | Admitting: Orthopedic Surgery

## 2017-01-06 DIAGNOSIS — E119 Type 2 diabetes mellitus without complications: Secondary | ICD-10-CM | POA: Diagnosis not present

## 2017-01-06 DIAGNOSIS — I10 Essential (primary) hypertension: Secondary | ICD-10-CM | POA: Insufficient documentation

## 2017-01-06 DIAGNOSIS — Z6841 Body Mass Index (BMI) 40.0 and over, adult: Secondary | ICD-10-CM | POA: Insufficient documentation

## 2017-01-06 DIAGNOSIS — Z7984 Long term (current) use of oral hypoglycemic drugs: Secondary | ICD-10-CM | POA: Insufficient documentation

## 2017-01-06 DIAGNOSIS — M75101 Unspecified rotator cuff tear or rupture of right shoulder, not specified as traumatic: Secondary | ICD-10-CM | POA: Diagnosis not present

## 2017-01-06 DIAGNOSIS — S43491A Other sprain of right shoulder joint, initial encounter: Secondary | ICD-10-CM | POA: Diagnosis not present

## 2017-01-06 DIAGNOSIS — Z79899 Other long term (current) drug therapy: Secondary | ICD-10-CM | POA: Insufficient documentation

## 2017-01-06 DIAGNOSIS — X58XXXA Exposure to other specified factors, initial encounter: Secondary | ICD-10-CM | POA: Insufficient documentation

## 2017-01-06 HISTORY — PX: SHOULDER ARTHROSCOPY WITH ROTATOR CUFF REPAIR AND SUBACROMIAL DECOMPRESSION: SHX5686

## 2017-01-06 LAB — HEPATIC FUNCTION PANEL
ALBUMIN: 3.3 g/dL — AB (ref 3.5–5.0)
ALK PHOS: 84 U/L (ref 38–126)
ALT: 88 U/L — ABNORMAL HIGH (ref 17–63)
AST: 59 U/L — ABNORMAL HIGH (ref 15–41)
BILIRUBIN INDIRECT: 0.5 mg/dL (ref 0.3–0.9)
BILIRUBIN TOTAL: 0.7 mg/dL (ref 0.3–1.2)
Bilirubin, Direct: 0.2 mg/dL (ref 0.1–0.5)
Total Protein: 6.6 g/dL (ref 6.5–8.1)

## 2017-01-06 LAB — PROTIME-INR
INR: 1.05
PROTHROMBIN TIME: 13.7 s (ref 11.4–15.2)

## 2017-01-06 LAB — APTT: aPTT: 28 seconds (ref 24–36)

## 2017-01-06 LAB — GLUCOSE, CAPILLARY
GLUCOSE-CAPILLARY: 120 mg/dL — AB (ref 65–99)
GLUCOSE-CAPILLARY: 129 mg/dL — AB (ref 65–99)

## 2017-01-06 SURGERY — SHOULDER ARTHROSCOPY WITH ROTATOR CUFF REPAIR AND SUBACROMIAL DECOMPRESSION
Anesthesia: General | Laterality: Right

## 2017-01-06 MED ORDER — MIDAZOLAM HCL 2 MG/2ML IJ SOLN
INTRAMUSCULAR | Status: AC
  Filled 2017-01-06: qty 2

## 2017-01-06 MED ORDER — PROPOFOL 10 MG/ML IV BOLUS
INTRAVENOUS | Status: AC
  Filled 2017-01-06: qty 20

## 2017-01-06 MED ORDER — FENTANYL CITRATE (PF) 250 MCG/5ML IJ SOLN
INTRAMUSCULAR | Status: AC
  Filled 2017-01-06: qty 5

## 2017-01-06 MED ORDER — SUGAMMADEX SODIUM 200 MG/2ML IV SOLN
INTRAVENOUS | Status: DC | PRN
  Administered 2017-01-06: 200 mg via INTRAVENOUS

## 2017-01-06 MED ORDER — SODIUM CHLORIDE 0.9 % IR SOLN
Status: DC | PRN
Start: 2017-01-06 — End: 2017-01-06
  Administered 2017-01-06 (×2): 3000 mL

## 2017-01-06 MED ORDER — SCOPOLAMINE 1 MG/3DAYS TD PT72
1.0000 | MEDICATED_PATCH | TRANSDERMAL | Status: DC
  Administered 2017-01-06: 1.5 mg via TRANSDERMAL
  Filled 2017-01-06: qty 1

## 2017-01-06 MED ORDER — MIDAZOLAM HCL 5 MG/5ML IJ SOLN
INTRAMUSCULAR | Status: DC | PRN
  Administered 2017-01-06 (×2): 1 mg via INTRAVENOUS

## 2017-01-06 MED ORDER — FENTANYL CITRATE (PF) 100 MCG/2ML IJ SOLN
INTRAMUSCULAR | Status: DC | PRN
  Administered 2017-01-06 (×3): 50 ug via INTRAVENOUS

## 2017-01-06 MED ORDER — HYDROMORPHONE HCL 1 MG/ML IJ SOLN: 0.2500 mg | INTRAMUSCULAR | Status: DC | PRN

## 2017-01-06 MED ORDER — POVIDONE-IODINE 7.5 % EX SOLN
Freq: Once | CUTANEOUS | Status: DC
  Filled 2017-01-06: qty 118

## 2017-01-06 MED ORDER — PROPOFOL 10 MG/ML IV BOLUS
INTRAVENOUS | Status: DC | PRN
  Administered 2017-01-06: 200 mg via INTRAVENOUS

## 2017-01-06 MED ORDER — ONDANSETRON HCL 4 MG/2ML IJ SOLN
INTRAMUSCULAR | Status: DC | PRN
  Administered 2017-01-06: 4 mg via INTRAVENOUS

## 2017-01-06 MED ORDER — EPINEPHRINE PF 1 MG/ML IJ SOLN
INTRAMUSCULAR | Status: AC
  Filled 2017-01-06: qty 1

## 2017-01-06 MED ORDER — PHENYLEPHRINE HCL 10 MG/ML IJ SOLN
INTRAVENOUS | Status: DC | PRN
  Administered 2017-01-06: 20 ug/min via INTRAVENOUS

## 2017-01-06 MED ORDER — BUPIVACAINE HCL (PF) 0.25 % IJ SOLN
INTRAMUSCULAR | Status: AC
  Filled 2017-01-06: qty 30

## 2017-01-06 MED ORDER — CEFAZOLIN SODIUM-DEXTROSE 2-4 GM/100ML-% IV SOLN
2.0000 g | INTRAVENOUS | Status: AC
  Administered 2017-01-06: 2 g via INTRAVENOUS
  Filled 2017-01-06: qty 100

## 2017-01-06 MED ORDER — PROMETHAZINE HCL 25 MG/ML IJ SOLN: 6.2500 mg | INTRAMUSCULAR | Status: DC | PRN

## 2017-01-06 MED ORDER — METOPROLOL TARTRATE 5 MG/5ML IV SOLN
INTRAVENOUS | Status: DC | PRN
  Administered 2017-01-06: 1.5 mg via INTRAVENOUS

## 2017-01-06 MED ORDER — BUPIVACAINE-EPINEPHRINE (PF) 0.5% -1:200000 IJ SOLN
INTRAMUSCULAR | Status: DC | PRN
  Administered 2017-01-06: 30 mL via PERINEURAL

## 2017-01-06 MED ORDER — DOCUSATE SODIUM 100 MG PO CAPS: 100.0000 mg | ORAL_CAPSULE | Freq: Three times a day (TID) | ORAL | 0 refills | Status: DC | PRN

## 2017-01-06 MED ORDER — ROCURONIUM BROMIDE 100 MG/10ML IV SOLN
INTRAVENOUS | Status: DC | PRN
  Administered 2017-01-06: 50 mg via INTRAVENOUS

## 2017-01-06 MED ORDER — SUCCINYLCHOLINE CHLORIDE 20 MG/ML IJ SOLN
INTRAMUSCULAR | Status: DC | PRN
  Administered 2017-01-06: 140 mg via INTRAVENOUS

## 2017-01-06 MED ORDER — LACTATED RINGERS IV SOLN
INTRAVENOUS | Status: DC | PRN
  Administered 2017-01-06: 07:00:00 via INTRAVENOUS

## 2017-01-06 MED ORDER — OXYCODONE-ACETAMINOPHEN 5-325 MG PO TABS: 1.0000 | ORAL_TABLET | ORAL | 0 refills | Status: DC | PRN

## 2017-01-06 SURGICAL SUPPLY — 67 items
ANCH SUT SWLK 19.1X4.75 VT (Anchor) ×2 IMPLANT
ANCHOR PEEK 4.75X19.1 SWLK C (Anchor) ×4 IMPLANT
BLADE SURG 11 STRL SS (BLADE) ×3 IMPLANT
BUR OVAL 4.0 (BURR) ×3 IMPLANT
CANNULA 5.75X71 LONG (CANNULA) ×3 IMPLANT
CANNULA TWIST IN 8.25X7CM (CANNULA) ×2 IMPLANT
CHLORAPREP W/TINT 26ML (MISCELLANEOUS) ×3 IMPLANT
DRAPE INCISE IOBAN 66X45 STRL (DRAPES) ×3 IMPLANT
DRAPE ORTHO SPLIT 77X108 STRL (DRAPES) ×6
DRAPE STERI 35X30 U-POUCH (DRAPES) ×3 IMPLANT
DRAPE SURG 17X23 STRL (DRAPES) ×3 IMPLANT
DRAPE SURG ORHT 6 SPLT 77X108 (DRAPES) ×2 IMPLANT
DRAPE U-SHAPE 47X51 STRL (DRAPES) ×3 IMPLANT
DRAPE U-SHAPE 76X120 STRL (DRAPES) ×3 IMPLANT
DRSG PAD ABDOMINAL 8X10 ST (GAUZE/BANDAGES/DRESSINGS) ×6 IMPLANT
GAUZE SPONGE 4X4 12PLY STRL (GAUZE/BANDAGES/DRESSINGS) ×3 IMPLANT
GAUZE SPONGE 4X4 16PLY XRAY LF (GAUZE/BANDAGES/DRESSINGS) ×2 IMPLANT
GAUZE XEROFORM 1X8 LF (GAUZE/BANDAGES/DRESSINGS) ×3 IMPLANT
GLOVE BIO SURGEON STRL SZ7 (GLOVE) ×6 IMPLANT
GLOVE BIO SURGEON STRL SZ7.5 (GLOVE) ×3 IMPLANT
GLOVE BIOGEL PI IND STRL 6.5 (GLOVE) IMPLANT
GLOVE BIOGEL PI IND STRL 7.0 (GLOVE) ×2 IMPLANT
GLOVE BIOGEL PI IND STRL 8 (GLOVE) ×1 IMPLANT
GLOVE BIOGEL PI INDICATOR 6.5 (GLOVE) ×2
GLOVE BIOGEL PI INDICATOR 7.0 (GLOVE) ×4
GLOVE BIOGEL PI INDICATOR 8 (GLOVE) ×2
GLOVE SURG SS PI 6.5 STRL IVOR (GLOVE) ×2 IMPLANT
GOWN STRL REUS W/ TWL LRG LVL3 (GOWN DISPOSABLE) ×3 IMPLANT
GOWN STRL REUS W/ TWL XL LVL3 (GOWN DISPOSABLE) ×1 IMPLANT
GOWN STRL REUS W/TWL LRG LVL3 (GOWN DISPOSABLE) ×9
GOWN STRL REUS W/TWL XL LVL3 (GOWN DISPOSABLE) ×3
KIT BASIN OR (CUSTOM PROCEDURE TRAY) ×3 IMPLANT
KIT ROOM TURNOVER OR (KITS) ×3 IMPLANT
MANIFOLD NEPTUNE II (INSTRUMENTS) ×3 IMPLANT
NDL HYPO 25GX1X1/2 BEV (NEEDLE) IMPLANT
NDL SCORPION MULTI FIRE (NEEDLE) IMPLANT
NDL SPNL 18GX3.5 QUINCKE PK (NEEDLE) ×1 IMPLANT
NDL SUT 6 .5 CRC .975X.05 MAYO (NEEDLE) IMPLANT
NEEDLE HYPO 25GX1X1/2 BEV (NEEDLE) IMPLANT
NEEDLE MAYO TAPER (NEEDLE)
NEEDLE SCORPION MULTI FIRE (NEEDLE) ×3 IMPLANT
NEEDLE SPNL 18GX3.5 QUINCKE PK (NEEDLE) ×3 IMPLANT
PACK SHOULDER (CUSTOM PROCEDURE TRAY) ×3 IMPLANT
PAD ARMBOARD 7.5X6 YLW CONV (MISCELLANEOUS) ×6 IMPLANT
PROBE BIPOLAR ATHRO 135MM 90D (MISCELLANEOUS) IMPLANT
RESECTOR FULL RADIUS 4.2MM (BLADE) ×5 IMPLANT
SLING ARM FOAM STRAP LRG (SOFTGOODS) ×3 IMPLANT
SLING ARM FOAM STRAP MED (SOFTGOODS) IMPLANT
SLING ARM FOAM STRAP XLG (SOFTGOODS) ×2 IMPLANT
SPONGE LAP 4X18 X RAY DECT (DISPOSABLE) IMPLANT
SUPPORT WRAP ARM LG (MISCELLANEOUS) ×3 IMPLANT
SUT 2 FIBERLOOP 20 STRT BLUE (SUTURE)
SUT ETHILON 3 0 PS 1 (SUTURE) ×5 IMPLANT
SUT TIGER TAPE 7 IN WHITE (SUTURE) IMPLANT
SUTURE 2 FIBERLOOP 20 STRT BLU (SUTURE) IMPLANT
SYR CONTROL 10ML LL (SYRINGE) ×3 IMPLANT
TAPE CLOTH SURG 6X10 WHT LF (GAUZE/BANDAGES/DRESSINGS) ×2 IMPLANT
TAPE FIBER 2MM 7IN #2 BLUE (SUTURE) IMPLANT
TOWEL OR 17X24 6PK STRL BLUE (TOWEL DISPOSABLE) ×3 IMPLANT
TOWEL OR 17X26 10 PK STRL BLUE (TOWEL DISPOSABLE) ×3 IMPLANT
TOWEL OR NON WOVEN STRL DISP B (DISPOSABLE) ×3 IMPLANT
TUBE CONNECTING 12'X1/4 (SUCTIONS) ×1
TUBE CONNECTING 12X1/4 (SUCTIONS) ×2 IMPLANT
TUBING ARTHROSCOPY IRRIG 16FT (MISCELLANEOUS) ×5 IMPLANT
WAND HAND CNTRL MULTIVAC 90 (MISCELLANEOUS) ×3 IMPLANT
WAND STAR VAC 90 (SURGICAL WAND) ×2 IMPLANT
WATER STERILE IRR 1000ML POUR (IV SOLUTION) ×3 IMPLANT

## 2017-01-06 NOTE — Anesthesia Procedure Notes (Addendum)
Anesthesia Regional Block: Interscalene brachial plexus block   Pre-Anesthetic Checklist: ,, timeout performed, Correct Patient, Correct Site, Correct Laterality, Correct Procedure, Correct Position, site marked, Risks and benefits discussed,  Surgical consent,  Pre-op evaluation,  At surgeon's request and post-op pain management  Laterality: Right  Prep: chloraprep       Needles:  Injection technique: Single-shot  Needle Type: Echogenic Stimulator Needle     Needle Length: 5cm  Needle Gauge: 22     Additional Needles:   Procedures: ultrasound guided, nerve stimulator,,,,,,   Nerve Stimulator or Paresthesia:  Response: bicep contraction, 0.45 mA,   Additional Responses:   Narrative:  Start time: 01/06/2017 7:04 AM End time: 01/06/2017 7:14 AM Injection made incrementally with aspirations every 5 mL.  Performed by: Personally  Anesthesiologist: Heather RobertsSINGER, Roxane Puerto  Additional Notes: Functioning IV was confirmed and monitors applied.  A 50mm 22ga echogenic arrow stimulator was used. Sterile prep and drape,hand hygiene and sterile gloves were used.Ultrasound guidance: relevant anatomy identified, needle position confirmed, local anesthetic spread visualized around nerve(s)., vascular puncture avoided.  Image printed for medical record.  Negative aspiration and negative test dose prior to incremental administration of local anesthetic. The patient tolerated the procedure well.

## 2017-01-06 NOTE — Op Note (Signed)
Procedure(s): SHOULDER ARTHROSCOPY WITH ROTATOR CUFF REPAIR, SUBACROMIAL DECOMPRESSION, BICEPS TENOTOMY Procedure Note  Trevor Garcia male 46 y.o. 01/06/2017  Procedure(s) and Anesthesia Type:    * RIGHT SHOULDER ARTHROSCOPY WITH ROTATOR CUFF REPAIR,        RIGHT ARTHROSCOPIC SUBACROMIAL DECOMPRESSION     RIGHT ARTHROSCOPIC DEBRIDEMENT ANTERIOR AND POSTERIOR LABRAL TEARS  Surgeon(s) and Role:    Jones Broom, MD - Primary     Surgeon: Mable Paris   Assistants: Damita Lack PA-C (Danielle was present and scrubbed throughout the procedure and was essential in positioning, assisting with the camera and instrumentation,, and closure)  Anesthesia: General endotracheal anesthesia with preoperative interscalene block given by the attending anesthesiologist    Procedure Detail  SHOULDER ARTHROSCOPY WITH ROTATOR CUFF REPAIR, SUBACROMIAL DECOMPRESSION, BICEPS TENOTOMY  Estimated Blood Loss: Min         Drains: none  Blood Given: none         Specimens: none        Complications:  * No complications entered in OR log *         Disposition: PACU - hemodynamically stable.         Condition: stable    Procedure:   INDICATIONS FOR SURGERY: The patient is 46 y.o. male who has had a long history of right shoulder pain which has been refractory nonoperative management. MRI showed what was believed to be a full thickness anterior tear. He was indicated for surgical treatment to try and decrease pain and restore function. He understood risks benefits and alternatives to the procedure and wished to proceed.  OPERATIVE FINDINGS: Examination under anesthesia: No stiffness or instability.  DESCRIPTION OF PROCEDURE: The patient was identified in preoperative  holding area where I personally marked the operative site after  verifying site, side, and procedure with the patient. An interscalene block was given by the attending anesthesiologist the holding area.  The  patient was taken back to the operating room where general anesthesia was induced without complication and was placed in the beach-chair position with the back  elevated about 60 degrees and all extremities and head and neck carefully padded and  positioned.   The right upper extremity was then prepped and  draped in a standard sterile fashion. The appropriate time-out  procedure was carried out. The patient did receive IV antibiotics  within 30 minutes of incision.   A small posterior portal incision was made and the arthroscope was introduced into the joint. An anterior portal was then established above the subscapularis using needle localization. Small cannula was placed anteriorly. Diagnostic arthroscopy was then carried out.  The subscapularis was noted to be completely intact. The biceps tendon was intact without any tenosynovitis or evidence for any subluxation. The biceps anchor was also intact. There was partial tearing of the anterior labrum involving about the inner 25% with a large flap. This was extensively debrided down to stable labrum. The posterior labrum was also large flap of about 5 mm. This was also extensively debrided back to healthy non-displaceable posterior labrum. The undersurface of the supraspinatus was carefully examined and noted to actually be completely intact from the articular side.  The arthroscope was then introduced into the subacromial space a standard lateral portal was established with needle localization. The shaver was used through the lateral portal to perform extensive bursectomy. 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  The rotator cuff tear was identified anteriorly and was a very high-grade  partial bursal sided tear involving about 90% of the insertion medial to lateral. Was only about 1.5 cm anterior to posterior. There was still some medial strand intact. The tuberosity was debrided down to bleeding  bone and the tendon edge was debrided. It was easily reducible and was delaminated such that the medial strands were not necessary to take down. A lateral portal was established with a large cannula and a posterior lateral viewing portal was established to assist in the repair. The repair was then carried out by first placing a 4.75 peak swivel lock anchor preloaded with 2 suture tapes just off the articular margin and intact medial strands in the center of the tuberosity anterior to posterior. The posterior through the free edge of the tear. These were then brought over to an additional 4.75 peak swivel lock anchor in a lateral row bringing the tendon nicely down over the prepared tuberosity with no undue tension. The final repair was felt to be complete and watertight.  The coracoacromial ligament was taken down off the anterior acromion with the ArthroCare exposing a moderate hooked anterior acromial spur. A high-speed bur was then used through the lateral portal to take down the anterior acromial spur from lateral to medial in a standard acromioplasty.  The acromioplasty was also viewed from the lateral portal and the bur was used as necessary to ensure that the acromion was completely flat from posterior to anterior.  The arthroscopic equipment was removed from the joint and the portals were closed with 3-0 nylon in an interrupted fashion. Sterile dressings were then applied including Xeroform 4 x 4's ABDs and tape. The patient was then allowed to awaken from general anesthesia, placed in a sling, transferred to the stretcher and taken to the recovery room in stable condition.   POSTOPERATIVE PLAN: The patient will be discharged home today and will followup in one week for suture removal and wound check.  He will follow the standard cuff protocol.

## 2017-01-06 NOTE — H&P (Signed)
Fayne MediateMark A Coval is an 46 y.o. male.   Chief Complaint: R shoulder pain and dysfunction HPI: R shoulder rotator cuff tear, biceps subluxation, failed conservative management.  Pain interferes with work and quality of life.  Past Medical History:  Diagnosis Date  . Cataract 2011   October and November  . Depression    Situational- since injury  . Diabetes mellitus without complication (HCC)    Type II  . GERD (gastroesophageal reflux disease)   . Hypertension   . Seasonal allergies     Past Surgical History:  Procedure Laterality Date  . cyst removed left arm Left   . EYE SURGERY Bilateral    hamm  . HAMMER TOE SURGERY Right 2006    Family History  Problem Relation Age of Onset  . Lupus Mother   . GER disease Sister    Social History:  reports that he has never smoked. He has never used smokeless tobacco. He reports that he does not drink alcohol or use drugs.  Allergies: No Known Allergies  Medications Prior to Admission  Medication Sig Dispense Refill  . amLODipine (NORVASC) 5 MG tablet Take 1 tablet (5 mg total) by mouth daily. (Patient taking differently: Take 5 mg by mouth daily as needed. ) 90 tablet 3  . glipiZIDE (GLUCOTROL) 5 MG tablet Take 1 tablet (5 mg total) by mouth daily before breakfast. 60 tablet 3  . loratadine (CLARITIN) 10 MG tablet Take 1 tablet (10 mg total) by mouth daily. 30 tablet 11  . metFORMIN (GLUCOPHAGE) 500 MG tablet Take 2 tablets (1,000 mg total) by mouth 2 (two) times daily with a meal. 180 tablet 3  . Ca Carbonate-Mag Hydroxide (ROLAIDS PO) Take 2 tablets by mouth daily as needed (indigestion).     . naproxen sodium (ANAPROX) 220 MG tablet Take 220 mg by mouth daily as needed (shoulder pain).      Results for orders placed or performed during the hospital encounter of 01/06/17 (from the past 48 hour(s))  Glucose, capillary     Status: Abnormal   Collection Time: 01/06/17  5:51 AM  Result Value Ref Range   Glucose-Capillary 120 (H) 65 -  99 mg/dL  Protime-INR     Status: None   Collection Time: 01/06/17  6:17 AM  Result Value Ref Range   Prothrombin Time 13.7 11.4 - 15.2 seconds   INR 1.05   APTT     Status: None   Collection Time: 01/06/17  6:17 AM  Result Value Ref Range   aPTT 28 24 - 36 seconds  Hepatic function panel     Status: Abnormal   Collection Time: 01/06/17  6:17 AM  Result Value Ref Range   Total Protein 6.6 6.5 - 8.1 g/dL   Albumin 3.3 (L) 3.5 - 5.0 g/dL   AST 59 (H) 15 - 41 U/L   ALT 88 (H) 17 - 63 U/L   Alkaline Phosphatase 84 38 - 126 U/L   Total Bilirubin 0.7 0.3 - 1.2 mg/dL   Bilirubin, Direct 0.2 0.1 - 0.5 mg/dL   Indirect Bilirubin 0.5 0.3 - 0.9 mg/dL   No results found.  ROS  Blood pressure (!) 153/92, pulse 75, temperature 97.9 F (36.6 C), temperature source Oral, resp. rate 20, SpO2 96 %. Physical Exam   Assessment/Plan R shoulder rotator cuff tear, biceps subluxation, failed conservative management.  Pain interferes with work and quality of life. Plan R arth RCR, SAD, poss biceps tenodesis. Risks / benefits of surgery  discussed Consent on chart  NPO for OR Preop antibiotics   Mable Paris, MD 01/06/2017, 7:18 AM

## 2017-01-06 NOTE — Discharge Instructions (Signed)

## 2017-01-06 NOTE — Transfer of Care (Signed)
Immediate Anesthesia Transfer of Care Note  Patient: Trevor Garcia  Procedure(s) Performed: Procedure(s) with comments: SHOULDER ARTHROSCOPY WITH ROTATOR CUFF REPAIR, SUBACROMIAL DECOMPRESSION (Right) - SHOULDER ARTHROSCOPY WITH ROTATOR CUFF REPAIR, SUBACROMIAL DECOMPRESSION, BICEPS TENOTOMY  Patient Location: PACU  Anesthesia Type:GA combined with regional for post-op pain  Level of Consciousness: awake, alert , oriented and patient cooperative  Airway & Oxygen Therapy: Patient Spontanous Breathing and Patient connected to nasal cannula oxygen  Post-op Assessment: Report given to RN and Post -op Vital signs reviewed and stable  Post vital signs: Reviewed and stable  Last Vitals:  Vitals:   01/06/17 0558  BP: (!) 153/92  Pulse: 75  Resp: 20  Temp: 36.6 C    Last Pain:  Vitals:   01/06/17 0558  TempSrc: Oral  PainSc: 3       Patients Stated Pain Goal: 3 (01/06/17 0558)  Complications: No apparent anesthesia complications

## 2017-01-06 NOTE — Anesthesia Postprocedure Evaluation (Addendum)
Anesthesia Post Note  Patient: Trevor Garcia  Procedure(s) Performed: Procedure(s) (LRB): SHOULDER ARTHROSCOPY WITH ROTATOR CUFF REPAIR, SUBACROMIAL DECOMPRESSION (Right)  Patient location during evaluation: PACU Anesthesia Type: General Level of consciousness: sedated Pain management: pain level controlled Vital Signs Assessment: post-procedure vital signs reviewed and stable Respiratory status: spontaneous breathing and respiratory function stable Cardiovascular status: stable Anesthetic complications: no       Last Vitals:  Vitals:   01/06/17 0930 01/06/17 0943  BP: 135/86 138/81  Pulse: 84 85  Resp: (!) 24 (!) 25  Temp:  36.9 C    Last Pain:  Vitals:   01/06/17 0558  TempSrc: Oral  PainSc: 3                  Mehkai Gallo DANIEL

## 2017-01-06 NOTE — Anesthesia Procedure Notes (Signed)
Procedure Name: Intubation Date/Time: 01/06/2017 7:34 AM Performed by: Salli Quarry Lollie Gunner Pre-anesthesia Checklist: Patient identified, Emergency Drugs available, Suction available and Patient being monitored Patient Re-evaluated:Patient Re-evaluated prior to inductionOxygen Delivery Method: Circle System Utilized Preoxygenation: Pre-oxygenation with 100% oxygen Intubation Type: IV induction Ventilation: Mask ventilation without difficulty Laryngoscope Size: Mac and 3 Grade View: Grade II Tube type: Oral Tube size: 7.5 mm Number of attempts: 1 Airway Equipment and Method: Stylet and Oral airway Placement Confirmation: ETT inserted through vocal cords under direct vision,  positive ETCO2 and breath sounds checked- equal and bilateral Secured at: 23 cm Tube secured with: Tape Dental Injury: Teeth and Oropharynx as per pre-operative assessment

## 2017-01-07 ENCOUNTER — Encounter (HOSPITAL_COMMUNITY): Payer: Self-pay | Admitting: Orthopedic Surgery

## 2017-01-09 NOTE — Progress Notes (Signed)
Thanks for keeping in the loop. I'll follow the LFTs.  I see he did well with his surgery as well.  Thank you for your care. Deliah BostonMichael Escher Harr, MS, PA-C 10:12 AM, 01/09/2017

## 2017-01-10 ENCOUNTER — Encounter: Payer: Self-pay | Admitting: Physician Assistant

## 2017-01-10 ENCOUNTER — Ambulatory Visit (INDEPENDENT_AMBULATORY_CARE_PROVIDER_SITE_OTHER): Payer: BLUE CROSS/BLUE SHIELD | Admitting: Physician Assistant

## 2017-01-10 DIAGNOSIS — Z6839 Body mass index (BMI) 39.0-39.9, adult: Secondary | ICD-10-CM

## 2017-01-10 DIAGNOSIS — E119 Type 2 diabetes mellitus without complications: Secondary | ICD-10-CM | POA: Insufficient documentation

## 2017-01-10 DIAGNOSIS — R945 Abnormal results of liver function studies: Secondary | ICD-10-CM

## 2017-01-10 DIAGNOSIS — R7989 Other specified abnormal findings of blood chemistry: Secondary | ICD-10-CM | POA: Insufficient documentation

## 2017-01-10 DIAGNOSIS — E669 Obesity, unspecified: Secondary | ICD-10-CM | POA: Insufficient documentation

## 2017-01-10 DIAGNOSIS — E1165 Type 2 diabetes mellitus with hyperglycemia: Secondary | ICD-10-CM | POA: Diagnosis not present

## 2017-01-10 DIAGNOSIS — I10 Essential (primary) hypertension: Secondary | ICD-10-CM | POA: Insufficient documentation

## 2017-01-10 MED ORDER — METFORMIN HCL ER (MOD) 1000 MG PO TB24: 2000.0000 mg | ORAL_TABLET | Freq: Every day | ORAL | 3 refills | Status: DC

## 2017-01-10 MED ORDER — BLOOD PRESSURE KIT DEVI: 1.0000 | Freq: Every day | 0 refills | Status: DC

## 2017-01-10 NOTE — Progress Notes (Signed)
01/10/2017 12:07 PM   DOB: 1971-02-09 / MRN: [id number]  SUBJECTIVE:  Trevor Garcia is a 46 y.o. male presenting for re-check diabetes. His blood sugar is doing exceedingly better. He has lost about 10 lbs.  He is really working on portion control per diabetes edu.  Denies sock and glove paresthesia.  Denies chest pain and SOB, and no new DOE.  He tells me that he can not take the full dose of metformin due to diarrhea.    He tells me he has missed his Norvasc today or yesterday. He denies HA and leg swelling.    Current Outpatient Prescriptions:  .  amLODipine (NORVASC) 5 MG tablet, Take 1 tablet (5 mg total) by mouth daily. (Patient taking differently: Take 5 mg by mouth daily as needed. ), Disp: 90 tablet, Rfl: 3 .  Ca Carbonate-Mag Hydroxide (ROLAIDS PO), Take 2 tablets by mouth daily as needed (indigestion). , Disp: , Rfl:  .  glipiZIDE (GLUCOTROL) 5 MG tablet, Take 1 tablet (5 mg total) by mouth daily before breakfast., Disp: 60 tablet, Rfl: 3 .  loratadine (CLARITIN) 10 MG tablet, Take 1 tablet (10 mg total) by mouth daily., Disp: 30 tablet, Rfl: 11 .  Blood Pressure Monitoring (BLOOD PRESSURE KIT) DEVI, 1 kit by Does not apply route daily., Disp: 1 Device, Rfl: 0 .  metFORMIN (GLUMETZA) 1000 MG (MOD) 24 hr tablet, Take 2 tablets (2,000 mg total) by mouth daily with breakfast., Disp: 60 tablet, Rfl: 3   He has No Known Allergies.   He  has a past medical history of Cataract (2011); Depression; Diabetes mellitus without complication ([place]); GERD (gastroesophageal reflux disease); Hypertension; and Seasonal allergies.    He  reports that he has never smoked. He has never used smokeless tobacco. He reports that he does not drink alcohol or use drugs. He  has no sexual activity history on file. The patient  has a past surgical history that includes Hammer toe surgery (Right, 2006); Eye surgery (Bilateral); cyst removed left arm (Left); and Shoulder arthroscopy with rotator cuff repair and  subacromial decompression (Right, 01/06/2017).  His family history includes GER disease in his sister; Lupus in his mother.  Review of Systems  Constitutional: Negative for chills and fever.  Skin: Negative for itching and rash.    The problem list and medications were reviewed and updated by myself where necessary and exist elsewhere in the encounter.   OBJECTIVE:  BP (!) 155/105   Pulse 80   Temp 98.7 F (37.1 C) (Oral)   Resp 18   Ht 5' 11.02" (1.804 m)   Wt 286 lb 3.2 oz (129.8 kg)   SpO2 95%   BMI 39.89 kg/m   Physical Exam  Constitutional: He appears well-developed. He is active and cooperative.  Non-toxic appearance.  HENT:  Right Ear: Hearing, tympanic membrane, external ear and ear canal normal.  Left Ear: Hearing, tympanic membrane, external ear and ear canal normal.  Nose: Nose normal. Right sinus exhibits no maxillary sinus tenderness and no frontal sinus tenderness. Left sinus exhibits no maxillary sinus tenderness and no frontal sinus tenderness.  Mouth/Throat: Uvula is midline, oropharynx is clear and moist and mucous membranes are normal. No oropharyngeal exudate, posterior oropharyngeal edema or tonsillar abscesses.  Eyes: Conjunctivae are normal. Pupils are equal, round, and reactive to light.  Cardiovascular: Normal rate, regular rhythm, S1 normal, S2 normal, normal heart sounds, intact distal pulses and normal pulses.  Exam reveals no gallop and no friction rub.  No murmur heard. Pulmonary/Chest: Effort normal. No stridor. No tachypnea. No respiratory distress. He has no wheezes. He has no rales.  Abdominal: He exhibits no distension.  Musculoskeletal: He exhibits no edema.  Lymphadenopathy:       Head (right side): No submandibular and no tonsillar adenopathy present.       Head (left side): No submandibular and no tonsillar adenopathy present.    He has no cervical adenopathy.  Neurological: He is alert.  Skin: Skin is warm and dry. He is not diaphoretic.  No pallor.  Vitals reviewed.    Weight /BMI 01/10/2017 12/21/2016 12/20/2016 12/13/2016  WEIGHT 286 lb 3.2 oz 291 lb 290 lb 291 lb  HEIGHT 5' 11.024" '5\' 11"'  '6\' 1"'  '6\' 1"'   BMI 39.89 kg/m2 40.59 kg/m2 38.26 kg/m2 38.39 kg/m2   Weight /BMI 12/09/2016 11/25/2016 09/11/2015 08/11/2015  WEIGHT 296 lb 296 lb 12.8 oz 278 lb 3.2 oz 282 lb  HEIGHT '5\' 11"'  '5\' 11"'  '5\' 11"'  '6\' 0"'   BMI 41.28 kg/m2 41.4 kg/m2 38.82 kg/m2 38.24 kg/m2   Lab Results  Component Value Date   TSH 0.914 09/11/2015   Lab Results  Component Value Date   HGBA1C 10.0 (H) 01/03/2017   Lab Results  Component Value Date   WBC 10.0 01/03/2017   HGB 16.1 01/03/2017   HCT 48.2 01/03/2017   MCV 84.0 01/03/2017   PLT 249 01/03/2017   Lab Results  Component Value Date   ALT 88 (H) 01/06/2017   AST 59 (H) 01/06/2017   ALKPHOS 84 01/06/2017   BILITOT 0.7 01/06/2017      No results found for this or any previous visit (from the past 72 hour(s)).  No results found.  ASSESSMENT AND PLAN:  Trevor Garcia was seen today for follow-up.  Diagnoses and all orders for this visit:  Type 2 diabetes mellitus with hyperglycemia, without long-term current use of insulin ([place]): Given diarrhea with metformin I am starting long acting.  -     Basic metabolic panel -     metFORMIN (GLUMETZA) 1000 MG (MOD) 24 hr tablet; Take 2 tablets (2,000 mg total) by mouth daily with breakfast.   Essential hypertension -     Blood Pressure Monitoring (BLOOD PRESSURE KIT) DEVI; 1 kit by Does not apply route daily.  Class 2 severe obesity due to excess calories with serious comorbidity and body mass index (BMI) of 39.0 to 39.9 in adult [facility]): See avs.  I am advising he lose about 12-24 lbs in the next three months.   Elevated LFTs        -     Hepatic Function Panel        -     Acute Hep Panel & Hep B Surface Ab   The patient is advised to call or return to clinic if he does not see an improvement in symptoms, or to seek the care of the closest emergency  department if he worsens with the above plan.   Philis Fendt, MHS, PA-C [facility] 01/10/2017 12:07 PM

## 2017-01-10 NOTE — Patient Instructions (Addendum)
Please come back in 3 months and I would like for you to have lost 12-24 lbs by that time. It is very important that you check you BP daily.  Once you are consistenly under 135/85 we can stop checking so often, however until that time we may have to adjust your medication. Please email you numbers in one week.  I would like to have at least 7 numbers so please check first thing in the morning.   Keep up the GOOD WORK.   IF you received an x-ray today, you will receive an invoice [facility]. Please contact [facility] at [phone] with questions or concerns regarding your invoice.   IF you received labwork today, you will receive an invoice from [facility]. Please contact [facility] at [phone] with questions or concerns regarding your invoice.   Our billing staff will not be able to assist you with questions regarding bills from these companies.  You will be contacted with the lab results as soon as they are available. The fastest way to get your results is to activate your My Chart account. Instructions are located on the last page of this paperwork. If you have not heard from us regarding the results in 2 weeks, please contact this office.

## 2017-01-11 ENCOUNTER — Ambulatory Visit: Payer: Self-pay

## 2017-01-11 LAB — BASIC METABOLIC PANEL WITH GFR
BUN/Creatinine Ratio: 14 (ref 9–20)
BUN: 13 mg/dL (ref 6–24)
CO2: 24 mmol/L (ref 18–29)
Calcium: 9.2 mg/dL (ref 8.7–10.2)
Chloride: 102 mmol/L (ref 96–106)
Creatinine, Ser: 0.92 mg/dL (ref 0.76–1.27)
GFR calc Af Amer: 114 mL/min/1.73 (ref >59)
GFR calc non Af Amer: 99 mL/min/1.73 (ref >59)
Glucose: 105 mg/dL — ABNORMAL HIGH (ref 65–99)
Potassium: 4.8 mmol/L (ref 3.5–5.2)
Sodium: 141 mmol/L (ref 134–144)

## 2017-01-11 LAB — ACUTE HEP PANEL AND HEP B SURFACE AB
Hep A IgM: NEGATIVE
Hep B C IgM: NEGATIVE
Hep C Virus Ab: <0.1 {s_co_ratio} (ref 0.0–0.9)
Hepatitis B Surf Ab Quant: <3.1 m[IU]/mL — ABNORMAL LOW (ref >9.9)
Hepatitis B Surface Ag: NEGATIVE

## 2017-01-11 LAB — HEPATIC FUNCTION PANEL
ALBUMIN: 4.3 g/dL (ref 3.5–5.5)
ALK PHOS: 106 IU/L (ref 39–117)
ALT: 69 IU/L — ABNORMAL HIGH (ref 0–44)
AST: 63 IU/L — AB (ref 0–40)
BILIRUBIN, DIRECT: 0.18 mg/dL (ref 0.00–0.40)
Bilirubin Total: 0.5 mg/dL (ref 0.0–1.2)
TOTAL PROTEIN: 7.4 g/dL (ref 6.0–8.5)

## 2017-01-17 NOTE — Progress Notes (Signed)
His blood sugar is SO MUCH BETTER.  I am very proud of his hard work. Liver enzymes are improving and was likely medication related.  We will watch this. Scheduling should be calling him. Deliah BostonMichael Clark, MS, PA-C 10:45 AM, 01/17/2017

## 2017-01-17 NOTE — Progress Notes (Signed)
Please  call and schedule him for 2 months from now.

## 2017-01-22 NOTE — Addendum Note (Signed)
Addendum  created 01/22/17 1015 by Saleemah Mollenhauer, MD   Sign clinical note    

## 2017-04-18 ENCOUNTER — Ambulatory Visit: Payer: BLUE CROSS/BLUE SHIELD | Admitting: Physician Assistant

## 2017-06-07 ENCOUNTER — Ambulatory Visit: Payer: BLUE CROSS/BLUE SHIELD | Admitting: Podiatry

## 2017-06-17 ENCOUNTER — Ambulatory Visit (INDEPENDENT_AMBULATORY_CARE_PROVIDER_SITE_OTHER): Payer: BLUE CROSS/BLUE SHIELD | Admitting: Podiatry

## 2017-06-17 ENCOUNTER — Encounter: Payer: Self-pay | Admitting: Podiatry

## 2017-06-17 VITALS — BP 157/98 | HR 78 | Resp 16 | Ht 71.0 in | Wt 270.0 lb

## 2017-06-17 DIAGNOSIS — L309 Dermatitis, unspecified: Secondary | ICD-10-CM | POA: Diagnosis not present

## 2017-06-17 DIAGNOSIS — L600 Ingrowing nail: Secondary | ICD-10-CM | POA: Diagnosis not present

## 2017-06-17 MED ORDER — TERBINAFINE HCL 250 MG PO TABS: ORAL_TABLET | ORAL | 0 refills | Status: DC

## 2017-06-17 NOTE — Patient Instructions (Addendum)
Onychomycosis/Fungal Toenails  WHAT IS IT? An infection that lies within the keratin of your nail plate that is caused by a fungus.  WHY ME? Fungal infections affect all ages, sexes, races, and creeds.  There may be many factors that predispose you to a fungal infection such as age, coexisting medical conditions such as diabetes, or an autoimmune disease; stress, medications, fatigue, genetics, etc.  Bottom line: fungus thrives in a warm, moist environment and your shoes offer such a location.  IS IT CONTAGIOUS? Theoretically, yes.  You do not want to share shoes, nail clippers or files with someone who has fungal toenails.  Walking around barefoot in the same room or sleeping in the same bed is unlikely to transfer the organism.  It is important to realize, however, that fungus can spread easily from one nail to the next on the same foot.  HOW DO WE TREAT THIS?  There are several ways to treat this condition.  Treatment may depend on many factors such as age, medications, pregnancy, liver and kidney conditions, etc.  It is best to ask your doctor which options are available to you.  1. No treatment.   Unlike many other medical concerns, you can live with this condition.  However for many people this can be a painful condition and may lead to ingrown toenails or a bacterial infection.  It is recommended that you keep the nails cut short to help reduce the amount of fungal nail. 2. Topical treatment.  These range from herbal remedies to prescription strength nail lacquers.  About 40-50% effective, topicals require twice daily application for approximately 9 to 12 months or until an entirely new nail has grown out.  The most effective topicals are medical grade medications available through physicians offices. 3. Oral antifungal medications.  With an 80-90% cure rate, the most common oral medication requires 3 to 4 months of therapy and stays in your system for a year as the new nail grows out.  Oral  antifungal medications do require blood work to make sure it is a safe drug for you.  A liver function panel will be performed prior to starting the medication and after the first month of treatment.  It is important to have the blood work performed to avoid any harmful side effects.  In general, this medication safe but blood work is required. 4. Laser Therapy.  This treatment is performed by applying a specialized laser to the affected nail plate.  This therapy is noninvasive, fast, and non-painful.  It is not covered by insurance and is therefore, out of pocket.  The results have been very good with a 80-95% cure rate.  The [facility] is the only practice in the area to offer this therapy. 5. Permanent Nail Avulsion.  Removing the entire nail so that a new nail will not grow back.  Soak Instructions    THE DAY AFTER THE PROCEDURE  Place 1/4 cup of epsom salts in a quart of warm tap water.  Submerge your foot or feet with outer bandage intact for the initial soak; this will allow the bandage to become moist and wet for easy lift off.  Once you remove your bandage, continue to soak in the solution for 20 minutes.  This soak should be done twice a day.  Next, remove your foot or feet from solution, blot dry the affected area and cover.  You may use a band aid large enough to cover the area or use gauze and  tape.  Apply other medications to the area as directed by the doctor such as polysporin neosporin.  IF YOUR SKIN BECOMES IRRITATED WHILE USING THESE INSTRUCTIONS, IT IS OKAY TO SWITCH TO  WHITE VINEGAR AND WATER. Or you may use antibacterial soap and water to keep the toe clean  Monitor for any signs/symptoms of infection. Call the office immediately if any occur or go directly to the emergency room. Call with any questions/concerns.    Long Term Care Instructions-Post Nail Surgery  You have had your ingrown toenail and root treated with a chemical.  This chemical causes a burn that will  drain and ooze like a blister.  This can drain for 6-8 weeks or longer.  It is important to keep this area clean, covered, and follow the soaking instructions dispensed at the time of your surgery.  This area will eventually dry and form a scab.  Once the scab forms you no longer need to soak or apply a dressing.  If at any time you experience an increase in pain, redness, swelling, or drainage, you should contact the office as soon as possible.

## 2017-06-17 NOTE — Progress Notes (Signed)
   Subjective:    Patient ID: Trevor MediateMark A Fallaw, male    DOB: 1971-03-29, 46 y.o.   MRN: [id number]  HPI    Review of Systems  All other systems reviewed and are negative.      Objective:   Physical Exam        Assessment & Plan:

## 2017-06-22 NOTE — Progress Notes (Signed)
Subjective:    Patient ID: Trevor Garcia, male   DOB: 46 y.o.   MRN: [id number]   HPI patient presents stating I'm having a lot of pain in my big toenails and I've had trouble with them for years with thickness and I can no longer cut them area patient does not smoke currently and likes to be active. Patient states that diabetes been under excellent control and runs between 101 120    Review of Systems  All other systems reviewed and are negative.       Objective:  Physical Exam  Constitutional: He appears well-developed and well-nourished.  Cardiovascular: Intact distal pulses.   Pulmonary/Chest: Effort normal.  Musculoskeletal: Normal range of motion.  Neurological: He is alert.  Skin: Skin is warm.  Nursing note and vitals reviewed.  neurovascular status found to be intact muscle strength adequate range of motion within normal limits. 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 Patient wants surgery and at this time I explained surgery and allow patient to read consent form going over risk. I infiltrated each hallux 60 mg Xylocaine Marcaine mixture removed the hallux nails exposed matrix and applied phenol 5 applications 30 seconds followed by alcohol lavaged sterile dressing. Patient will begin soaks and will be seen back to recheck

## 2017-06-22 NOTE — Progress Notes (Signed)
I need to see him back for his blood pressure.  Please call and make an appointment in early December for him. Deliah BostonMichael Clark, MS, PA-C 11:13 AM, 06/22/2017

## 2017-06-24 ENCOUNTER — Telehealth: Payer: Self-pay | Admitting: Podiatry

## 2017-06-24 MED ORDER — CEPHALEXIN 500 MG PO CAPS: 500.0000 mg | ORAL_CAPSULE | Freq: Three times a day (TID) | ORAL | 0 refills | Status: DC

## 2017-06-24 NOTE — Addendum Note (Signed)
Addended by: Alphia Kava'CONNELL, Makailee Nudelman D on: 06/24/2017 02:42 PM   Modules accepted: Orders

## 2017-06-24 NOTE — Telephone Encounter (Signed)
I had bilateral ingrown nail procedures last Friday. I'm experiencing a lot of irritation and puffiness at the cuticle of the toe. I think I might see a little bit of pus or maybe some infection, its a burning feeling. I don't know what to do. I'm going to soak them right now in some epsom salt and maybe a little peroxide and see if that helps. This started within the past two days and its looking worse today, as the color is turning a dark brown around the cuticle and its very sensitive. Please call me back at [phone]. Thank you.

## 2017-06-24 NOTE — Telephone Encounter (Signed)
Start keflex 500mg  TID x 10 days. Epsom salt soaks and antibiotic ointment and bandage. If he can come in today I will be happy to see him.

## 2017-06-24 NOTE — Telephone Encounter (Signed)
I informed pt that Dr. Ardelle AntonWagoner had prescribed an antibiotic to be picked up at the [facility], and to soak the toes twice daily for 20 minutes each session pat dry and cover with a lightly coated antibiotic bandaid. I told pt he would be doing the soaks for 4-6 weeks until the area got a dry hard scab with out drainage, redness or swelling, I told pt at the end of the 4th week perform the last soak of the day, pat dry and leave off the antibiotic bandaid,if the are got a dry hard scab with out redness, swelling or drainage he could stop the soaks, if the symptoms continued to perform the soaks another 2 weeks then test again.

## 2017-06-29 ENCOUNTER — Encounter: Payer: Self-pay | Admitting: Podiatry

## 2017-06-29 ENCOUNTER — Ambulatory Visit (INDEPENDENT_AMBULATORY_CARE_PROVIDER_SITE_OTHER): Payer: BLUE CROSS/BLUE SHIELD | Admitting: Podiatry

## 2017-06-29 DIAGNOSIS — L03032 Cellulitis of left toe: Secondary | ICD-10-CM

## 2017-06-29 NOTE — Progress Notes (Signed)
Subjective:    Patient ID: Trevor MediateMark A Garcia, male   DOB: 46 y.o.   MRN: [id number]   HPI patient presents with discoloration and concerns about tissue on his big toenails bilateral that were removed    ROS      Objective:  Physical Exam neurovascular status intact with patient's hallux nail showing crusted tissue that has slight redness and drainage but localized with no proximal edema erythema     Assessment:    Possible very mild paronychia infection bilateral     Plan:    Explained that it is normal to have this and at this point I do not think it'll be an issue but I rather that he go ahead and soak

## 2017-07-19 ENCOUNTER — Telehealth: Payer: Self-pay | Admitting: Podiatry

## 2017-07-19 NOTE — Telephone Encounter (Signed)
I told Trevor Garcia if he was still having a lot of pain and discoloration he would benefit from seeing Dr. Charlsie Merlesegal. Transferred Trevor Garcia to schedulers.

## 2017-07-19 NOTE — Telephone Encounter (Signed)
I was seen by Dr. Charlsie Merlesegal and he removed both great toenails in October. I've been soaking them in Epson salt. I'm feeling some heat like a sharp firey pain that is shooting through the base of the toe and is going into the foot in both sometimes. It happens sporadically through out the day. I do have some discoloration at the base of both toes. I'm also using white vinegar twice to sometimes three times a day. I'm also using neosporin as well. Please let me know if you think I need an appointment and I can look at my schedule. My phone number is [phone]. Thank you and I look forward to hearing from you.

## 2017-07-22 ENCOUNTER — Ambulatory Visit (INDEPENDENT_AMBULATORY_CARE_PROVIDER_SITE_OTHER): Payer: BLUE CROSS/BLUE SHIELD | Admitting: Podiatry

## 2017-07-22 ENCOUNTER — Encounter: Payer: Self-pay | Admitting: Podiatry

## 2017-07-22 DIAGNOSIS — L03032 Cellulitis of left toe: Secondary | ICD-10-CM | POA: Diagnosis not present

## 2017-07-22 NOTE — Progress Notes (Signed)
Subjective:   Patient ID: Trevor Garcia, male   DOB: 46 y.o.   MRN: [id number]   HPI Some discoloration of the left hallux on the lateral side and was concerned about the condition of the toes in general   ROS      Objective:  Physical Exam  Neurovascular status intact with slight drainage left hallux lateral side localized in nature with discoloration of the hallux bilateral which appears to be related to chemical application     Assessment:  Mild localized paronychia with no proximal spread with discoloration     Plan:  Review discoloration is normal and should resolve over time and at this point recommended soaks therapy and anti-inflammatories and patient will be seen back on an as-needed basis       patient presents with

## 2017-09-14 ENCOUNTER — Emergency Department (HOSPITAL_COMMUNITY): Payer: BLUE CROSS/BLUE SHIELD

## 2017-09-14 ENCOUNTER — Encounter (HOSPITAL_COMMUNITY): Payer: Self-pay | Admitting: *Deleted

## 2017-09-14 ENCOUNTER — Emergency Department (HOSPITAL_COMMUNITY)
Admission: EM | Admit: 2017-09-14 | Discharge: 2017-09-14 | Disposition: A | Discharge status: Home / Self Care | Payer: BLUE CROSS/BLUE SHIELD | Attending: Emergency Medicine | Admitting: Emergency Medicine

## 2017-09-14 ENCOUNTER — Other Ambulatory Visit: Payer: Self-pay

## 2017-09-14 DIAGNOSIS — W000XXA Fall on same level due to ice and snow, initial encounter: Secondary | ICD-10-CM | POA: Insufficient documentation

## 2017-09-14 DIAGNOSIS — Y92481 Parking lot as the place of occurrence of the external cause: Secondary | ICD-10-CM | POA: Diagnosis not present

## 2017-09-14 DIAGNOSIS — M25551 Pain in right hip: Secondary | ICD-10-CM

## 2017-09-14 DIAGNOSIS — I10 Essential (primary) hypertension: Secondary | ICD-10-CM | POA: Insufficient documentation

## 2017-09-14 DIAGNOSIS — Y999 Unspecified external cause status: Secondary | ICD-10-CM | POA: Insufficient documentation

## 2017-09-14 DIAGNOSIS — Z7984 Long term (current) use of oral hypoglycemic drugs: Secondary | ICD-10-CM | POA: Insufficient documentation

## 2017-09-14 DIAGNOSIS — Z23 Encounter for immunization: Secondary | ICD-10-CM | POA: Diagnosis not present

## 2017-09-14 DIAGNOSIS — E119 Type 2 diabetes mellitus without complications: Secondary | ICD-10-CM | POA: Insufficient documentation

## 2017-09-14 DIAGNOSIS — Z79899 Other long term (current) drug therapy: Secondary | ICD-10-CM | POA: Insufficient documentation

## 2017-09-14 DIAGNOSIS — M25561 Pain in right knee: Secondary | ICD-10-CM

## 2017-09-14 DIAGNOSIS — S80211A Abrasion, right knee, initial encounter: Secondary | ICD-10-CM | POA: Diagnosis not present

## 2017-09-14 DIAGNOSIS — Y9389 Activity, other specified: Secondary | ICD-10-CM | POA: Insufficient documentation

## 2017-09-14 DIAGNOSIS — S8991XA Unspecified injury of right lower leg, initial encounter: Secondary | ICD-10-CM | POA: Diagnosis present

## 2017-09-14 MED ORDER — BACITRACIN ZINC 500 UNIT/GM EX OINT
TOPICAL_OINTMENT | Freq: Once | CUTANEOUS | Status: AC
  Administered 2017-09-14: 08:00:00 via TOPICAL
  Filled 2017-09-14: qty 0.9

## 2017-09-14 MED ORDER — TETANUS-DIPHTH-ACELL PERTUSSIS 5-2.5-18.5 LF-MCG/0.5 IM SUSP
0.5000 mL | Freq: Once | INTRAMUSCULAR | Status: AC
  Administered 2017-09-14: 0.5 mL via INTRAMUSCULAR
  Filled 2017-09-14: qty 0.5

## 2017-09-14 MED ORDER — KETOROLAC TROMETHAMINE 30 MG/ML IJ SOLN
30.0000 mg | Freq: Once | INTRAMUSCULAR | Status: AC
  Administered 2017-09-14: 30 mg via INTRAMUSCULAR
  Filled 2017-09-14: qty 1

## 2017-09-14 MED ORDER — ACETAMINOPHEN 325 MG PO TABS
650.0000 mg | ORAL_TABLET | Freq: Once | ORAL | Status: AC
Start: 2017-09-14 — End: 2017-09-14
  Administered 2017-09-14: 650 mg via ORAL
  Filled 2017-09-14: qty 2

## 2017-09-14 MED ORDER — IBUPROFEN 800 MG PO TABS: 800.0000 mg | ORAL_TABLET | Freq: Three times a day (TID) | ORAL | 0 refills | Status: AC | PRN

## 2017-09-14 NOTE — Discharge Instructions (Signed)
It was my pleasure taking care of you today!  ° °Ibuprofen as needed for pain. Use crutches as needed for comfort. Ice and elevate knee throughout the day. ° °Follow up with the orthopedist listed if symptoms do not improve in one week.  ° °Return to the ER for new or worsening symptoms, any additional concerns.  ° °

## 2017-09-14 NOTE — ED Triage Notes (Signed)
States he slipped and fell on some ice at [employer] landed on his rigbht knee, c/o right knee pain and hip pain

## 2017-09-14 NOTE — ED Provider Notes (Signed)
[place] EMERGENCY DEPARTMENT Provider Note   CSN: [id number] Arrival date & time: 09/14/17  0630     History   Chief Complaint Chief Complaint  Patient presents with  . Fall    HPI Trevor Garcia is a 47 y.o. male.  The history is provided by the patient and medical records. No language interpreter was used.  Fall    Trevor Garcia is a 47 y.o. male  with a PMH of DM2, HTN who presents to the Emergency Department for evaluation after mechanical fall just prior to arrival. Patient states that he was getting out of his truck at the gas station when he slipped on a sheet of ice. He fell and landed on right knee. No head injury or LOC. He feels as if he probably landed on right hip as well as it is sore. Associated with laceration to the right knee. Unsure of tetanus status. No medications prior to arrival. Knee pain worse with movement, better when kept straight. No history of prior injuries to RLE. No numbness, tingling or weakness.    Past Medical History:  Diagnosis Date  . Cataract 2011   October and November  . Depression    Situational- since injury  . Diabetes mellitus without complication ([place])    Type II  . GERD (gastroesophageal reflux disease)   . Hypertension   . Seasonal allergies     Patient Active Problem List   Diagnosis Date Noted  . Diabetes ([place]) 01/10/2017  . Essential hypertension 01/10/2017  . Obesity 01/10/2017  . Elevated LFTs 01/10/2017    Past Surgical History:  Procedure Laterality Date  . cyst removed left arm Left   . EYE SURGERY Bilateral    hamm  . HAMMER TOE SURGERY Right 2006  . SHOULDER ARTHROSCOPY WITH ROTATOR CUFF REPAIR AND SUBACROMIAL DECOMPRESSION Right 01/06/2017   Procedure: SHOULDER ARTHROSCOPY WITH ROTATOR CUFF REPAIR, SUBACROMIAL DECOMPRESSION;  Surgeon: Tania Ade, MD;  Location: [facility];  Service: Orthopedics;  Laterality: Right;  SHOULDER ARTHROSCOPY WITH ROTATOR CUFF REPAIR, SUBACROMIAL  DECOMPRESSION, BICEPS TENOTOMY       Home Medications    Prior to Admission medications   Medication Sig Start Date End Date Taking? Authorizing Provider  amLODipine (NORVASC) 5 MG tablet Take 1 tablet (5 mg total) by mouth daily. Patient taking differently: Take 5 mg by mouth daily as needed.  12/20/16   Tereasa Coop, PA-C  Blood Pressure Monitoring (BLOOD PRESSURE KIT) DEVI 1 kit by Does not apply route daily. 01/10/17   Tereasa Coop, PA-C  Ca Carbonate-Mag Hydroxide (ROLAIDS PO) Take 2 tablets by mouth daily as needed (indigestion).     [provider]  cephALEXin (KEFLEX) 500 MG capsule Take 1 capsule (500 mg total) by mouth 3 (three) times daily. 06/24/17   Trula Slade, DPM  glipiZIDE (GLUCOTROL) 5 MG tablet Take 1 tablet (5 mg total) by mouth daily before breakfast. 12/20/16   Tereasa Coop, PA-C  ibuprofen (ADVIL,MOTRIN) 800 MG tablet Take 1 tablet (800 mg total) by mouth every 8 (eight) hours as needed for mild pain or moderate pain. 09/14/17   Ward, Ozella Almond, PA-C  loratadine (CLARITIN) 10 MG tablet Take 1 tablet (10 mg total) by mouth daily. 12/20/16   Tereasa Coop, PA-C  metFORMIN (GLUMETZA) 1000 MG (MOD) 24 hr tablet Take 2 tablets (2,000 mg total) by mouth daily with breakfast. 01/10/17   Tereasa Coop, PA-C  terbinafine (LAMISIL) 250 MG tablet  Please take one a day x 7days, repeat every 4 weeks x 4 months 06/17/17   Wallene Huh, DPM    Family History Family History  Problem Relation Age of Onset  . Lupus Mother   . GER disease Sister     Social History Social History   Tobacco Use  . Smoking status: Never Smoker  . Smokeless tobacco: Never Used  Substance Use Topics  . Alcohol use: No    Alcohol/week: 0.0 oz  . Drug use: No     Allergies   Patient has no known allergies.   Review of Systems Review of Systems  Musculoskeletal: Positive for arthralgias and myalgias.  Skin: Positive for wound.  All other systems reviewed  and are negative.    Physical Exam Updated Vital Signs BP (!) 142/89 (BP Location: Left Arm)   Pulse 64   Temp 98 F (36.7 C) (Oral)   Resp 18   Ht '5\' 11"'  (1.803 m)   Wt 124.7 kg (275 lb)   SpO2 97%   BMI 38.35 kg/m   Physical Exam  Constitutional: He is oriented to person, place, and time. He appears well-developed and well-nourished. No distress.  HENT:  Head: Normocephalic and atraumatic.  Cardiovascular: Normal rate, regular rhythm and normal heart sounds.  No murmur heard. Pulmonary/Chest: Effort normal and breath sounds normal. No respiratory distress.  Abdominal: Soft. He exhibits no distension. There is no tenderness.  Musculoskeletal:  Tenderness to the lateral right hip. Full ROM of the hip. No midline C/T/L tenderness.Tenderness to palpation to anterior knee over the patella. Decreased ROM 2/2 pain. No joint line tenderness. No joint effusion or swelling appreciated. No abnormal alignment or patellar mobility. Negative drawer's, Lachman's and McMurray's.  No crepitus. 2+ DP pulses bilaterally. All compartments are soft. Sensation intact distal to injury.  Neurological: He is alert and oriented to person, place, and time.  Skin: Skin is warm and dry.  1 cm skin tear to anterior right knee.  Nursing note and vitals reviewed.    ED Treatments / Results  Labs (all labs ordered are listed, but only abnormal results are displayed) Labs Reviewed - No data to display  EKG  EKG Interpretation None       Radiology Dg Knee Complete 4 Views Right  Result Date: 09/14/2017 CLINICAL DATA:  Slip and fall with right knee pain, initial encounter EXAM: RIGHT KNEE - COMPLETE 4+ VIEW COMPARISON:  None. FINDINGS: Mild degenerative changes are noted most marked in the patellofemoral space. No joint effusion is seen. No acute fracture or dislocation is noted. IMPRESSION: Mild degenerative change without acute abnormality. Electronically Signed   By: Inez Catalina M.D.   On:  09/14/2017 07:12   Dg Hip Unilat W Or Wo Pelvis 2-3 Views Right  Result Date: 09/14/2017 CLINICAL DATA:  Slip and fall with right hip pain, initial encounter EXAM: DG HIP (WITH OR WITHOUT PELVIS) 3V RIGHT COMPARISON:  None. FINDINGS: There is no evidence of hip fracture or dislocation. There is no evidence of arthropathy or other focal bone abnormality. IMPRESSION: No acute abnormality noted. Electronically Signed   By: Inez Catalina M.D.   On: 09/14/2017 08:11    Procedures Procedures (including critical care time)  Medications Ordered in ED Medications  ketorolac (TORADOL) 30 MG/ML injection 30 mg (30 mg Intramuscular Given 09/14/17 0801)  acetaminophen (TYLENOL) tablet 650 mg (650 mg Oral Given 09/14/17 0800)  Tdap (BOOSTRIX) injection 0.5 mL (0.5 mLs Intramuscular Given 09/14/17 0803)  bacitracin  ointment ( Topical Given 09/14/17 0806)     Initial Impression / Assessment and Plan / ED Course  I have reviewed the triage vital signs and the nursing notes.  Pertinent labs & imaging results that were available during my care of the patient were reviewed by me and considered in my medical decision making (see chart for details).     Trevor Garcia is a 47 y.o. male who presents to ED for relation after mechanical fall just prior to arrival.  Patient slipped on a piece of ice, falling on his right knee and lateral hip. No head injury or LOC. No midline C/T/L spine tenderness. Neurovascularly intact on exam.  Knee with skin tear not requiring suture repair.  Wound was irrigated, cleaned and dressed in ED.  Tetanus updated.  X-rays negative.  Crutches and knee sleeve provided in the ED.  Ortho follow up encouraged.  Symptomatic home care instructions and return precautions discussed.  All questions answered.   Final Clinical Impressions(s) / ED Diagnoses   Final diagnoses:  Acute pain of right knee  Abrasion of right knee, initial encounter  Right hip pain    ED Discharge Orders         Ordered    ibuprofen (ADVIL,MOTRIN) 800 MG tablet  Every 8 hours PRN     09/14/17 0817       Ward, Ozella Almond, PA-C 09/14/17 9741    Pattricia Boss, MD 09/14/17 1715

## 2017-09-21 ENCOUNTER — Ambulatory Visit: Payer: BLUE CROSS/BLUE SHIELD | Admitting: Physician Assistant

## 2017-09-21 ENCOUNTER — Encounter: Payer: Self-pay | Admitting: Physician Assistant

## 2017-09-21 VITALS — BP 130/80 | HR 82 | Temp 97.9°F | Resp 17 | Ht 71.0 in | Wt 273.0 lb

## 2017-09-21 DIAGNOSIS — R7309 Other abnormal glucose: Secondary | ICD-10-CM

## 2017-09-21 DIAGNOSIS — E1165 Type 2 diabetes mellitus with hyperglycemia: Secondary | ICD-10-CM | POA: Diagnosis not present

## 2017-09-21 LAB — POCT URINALYSIS DIP (MANUAL ENTRY)
BILIRUBIN UA: NEGATIVE mg/dL
Bilirubin, UA: NEGATIVE
Glucose, UA: >=1000 mg/dL — AB
LEUKOCYTES UA: NEGATIVE
NITRITE UA: NEGATIVE
PH UA: 5.5 (ref 5.0–8.0)
PROTEIN UA: NEGATIVE mg/dL
Spec Grav, UA: 1.01 (ref 1.010–1.025)
UROBILINOGEN UA: 0.2 U/dL

## 2017-09-21 LAB — POCT GLYCOSYLATED HEMOGLOBIN (HGB A1C): HEMOGLOBIN A1C: 14

## 2017-09-21 MED ORDER — DULAGLUTIDE 1.5 MG/0.5ML ~~LOC~~ SOAJ: SUBCUTANEOUS | 2 refills | Status: AC

## 2017-09-21 NOTE — Progress Notes (Signed)
09/21/2017 4:12 PM   DOB: 1971-02-22 / MRN: [id number]  SUBJECTIVE:  Trevor Garcia is a 47 y.o. male presenting for right-sided knee pain that started after fall.  Patient was seen in the ED and x-rays and exam were negative at that time.  He is here today for follow-up of that problem.  He tells me the pain has gotten better.  He has a small abrasion on the knee as well.  He was unaware that his diabetes is so poorly controlled.  He tells me that he is taking glipizide and metformin as prescribed.  He would like to increase his medicine today before seeing an endocrinologist.  He denies chest pain, shortness of breath, leg swelling, dizziness.  He is losing weight.  He denies nocturia.  He has gone to diabetes education and admits that his diet is very poor at this time.  He has No Known Allergies.   He  has a past medical history of Cataract (2011), Depression, Diabetes mellitus without complication (HCC), GERD (gastroesophageal reflux disease), Hypertension, and Seasonal allergies.    He  reports that  has never smoked. he has never used smokeless tobacco. He reports that he does not drink alcohol or use drugs. He  has no sexual activity history on file. The patient  has a past surgical history that includes Hammer toe surgery (Right, 2006); Eye surgery (Bilateral); cyst removed left arm (Left); and Shoulder arthroscopy with rotator cuff repair and subacromial decompression (Right, 01/06/2017).  His family history includes GER disease in his sister; Lupus in his mother.  Review of Systems  Constitutional: Negative for chills, diaphoresis and fever.  Eyes: Negative.   Respiratory: Negative for cough, hemoptysis, sputum production, shortness of breath and wheezing.   Cardiovascular: Negative for chest pain, orthopnea and leg swelling.  Gastrointestinal: Negative for nausea.  Musculoskeletal: Positive for joint pain.  Skin: Negative for rash.  Neurological: Negative for dizziness, sensory  change, speech change, focal weakness and headaches.    The problem list and medications were reviewed and updated by myself where necessary and exist elsewhere in the encounter.   OBJECTIVE:  BP 130/80   Pulse 82   Temp 97.9 F (36.6 C) (Oral)   Resp 17   Ht 5\' 11"  (1.803 m)   Wt 273 lb (123.8 kg)   SpO2 98%   BMI 38.08 kg/m   Physical Exam  Constitutional: He is oriented to person, place, and time. He appears well-developed. He is active and cooperative.  Non-toxic appearance.  Eyes: EOM are normal. Pupils are equal, round, and reactive to light.  Cardiovascular: Normal rate, regular rhythm, S1 normal, S2 normal, normal heart sounds, intact distal pulses and normal pulses. Exam reveals no gallop and no friction rub.  No murmur heard. Pulmonary/Chest: Effort normal. No stridor. No tachypnea. No respiratory distress. He has no wheezes. He has no rales.  Abdominal: He exhibits no distension.  Musculoskeletal: He exhibits no edema.  Neurological: He is alert and oriented to person, place, and time. He has normal strength and normal reflexes. He is not disoriented. No cranial nerve deficit or sensory deficit. He exhibits normal muscle tone. Coordination and gait normal.  Skin: Skin is warm and dry. He is not diaphoretic. No pallor.  Psychiatric: His behavior is normal.  Vitals reviewed.   Results for orders placed or performed in visit on 09/21/17 (from the past 72 hour(s))  POCT glycosylated hemoglobin (Hb A1C)     Status: None   Collection  Time: 09/21/17  3:10 PM  Result Value Ref Range   Hemoglobin A1C 14.0   POCT urinalysis dipstick     Status: Abnormal   Collection Time: 09/21/17  3:14 PM  Result Value Ref Range   Color, UA yellow yellow   Clarity, UA clear clear   Glucose, UA >=1,000 (A) negative mg/dL   Bilirubin, UA negative negative   Ketones, POC UA negative negative mg/dL   Spec Grav, UA 4.0981.010 1.1911.010 - 1.025   Blood, UA trace-intact (A) negative   pH, UA 5.5 5.0  - 8.0   Protein Ur, POC negative negative mg/dL   Urobilinogen, UA 0.2 0.2 or 1.0 E.U./dL   Nitrite, UA Negative Negative   Leukocytes, UA Negative Negative    No results found.  ASSESSMENT AND PLAN:  Trevor Garcia was seen today for knee pain.  Diagnoses and all orders for this visit:  Elevated hemoglobin A1c -     POCT glycosylated hemoglobin (Hb A1C) -     POCT urinalysis dipstick -     Cancel: Ambulatory referral to Endocrinology  Uncontrolled type 2 diabetes mellitus with hyperglycemia [facility](HCC): Patient compliant with glipizide, metformin.  Adding on Trulicity.  He will come back in 3 months for an A1c recheck.  He will work on his diet.  If he cannot get his A1c under 10 I will have to send him to endocrinology.  Of note patient has been lost to follow-up in the past.  I have placed a reminder in epic to alert myself and Ms. Cristal DeerChristopher CMA that he needs to be seen.  -     Renal Function Panel -     Hepatic Function Panel -     Dulaglutide (TRULICITY) 1.5 MG/0.5ML SOPN; Start 0.75 mg subcutaneous one time a week for two weeks.  Start 1.5 mg every week thereafter.    The patient is advised to call or return to clinic if he does not see an improvement in symptoms, or to seek the care of the closest emergency department if he worsens with the above plan.   Deliah BostonMichael Garcia, MHS, PA-C [facility] at [facility] 09/21/2017 4:12 PM

## 2017-09-21 NOTE — Patient Instructions (Addendum)
  Continue taking metformin and glipizide.  Add in the Trulicity.  If we can get under an A1c of 10 in the next 3 months that I can manage her here.  If we cannot that I will need to send you to Endo.  Take tylenol 1000 mg every 8 hours as needed for knee pain.   Come back in three months for a diabetes recheck.     IF you received an x-ray today, you will receive an invoice from [facility]. Please contact [facility] at [phone] with questions or concerns regarding your invoice.   IF you received labwork today, you will receive an invoice from [facility]. Please contact [facility] at [phone] with questions or concerns regarding your invoice.   Our billing staff will not be able to assist you with questions regarding bills from these companies.  You will be contacted with the lab results as soon as they are available. The fastest way to get your results is to activate your My Chart account. Instructions are located on the last page of this paperwork. If you have not heard from us regarding the results in 2 weeks, please contact this office.

## 2017-09-22 ENCOUNTER — Telehealth: Payer: Self-pay

## 2017-09-22 ENCOUNTER — Other Ambulatory Visit: Payer: Self-pay | Admitting: Physician Assistant

## 2017-09-22 LAB — SPECIMEN STATUS

## 2017-09-22 LAB — RENAL FUNCTION PANEL
Albumin: 4.2 g/dL (ref 3.5–5.5)
BUN/Creatinine Ratio: 11 (ref 9–20)
BUN: 12 mg/dL (ref 6–24)
CALCIUM: 9.4 mg/dL (ref 8.7–10.2)
CHLORIDE: 92 mmol/L — AB (ref 96–106)
CO2: 18 mmol/L — AB (ref 20–29)
Creatinine, Ser: 1.05 mg/dL (ref 0.76–1.27)
GFR calc Af Amer: 97 mL/min/{1.73_m2} (ref >59)
GFR calc non Af Amer: 84 mL/min/{1.73_m2} (ref >59)
Glucose: 571 mg/dL (ref 65–99)
PHOSPHORUS: 4.1 mg/dL (ref 2.5–4.5)
POTASSIUM: 4.6 mmol/L (ref 3.5–5.2)
SODIUM: 132 mmol/L — AB (ref 134–144)

## 2017-09-22 LAB — HEPATIC FUNCTION PANEL
ALT: 67 IU/L — ABNORMAL HIGH (ref 0–44)
AST: 47 IU/L — ABNORMAL HIGH (ref 0–40)
Alkaline Phosphatase: 112 IU/L (ref 39–117)
BILIRUBIN TOTAL: 0.6 mg/dL (ref 0.0–1.2)
Bilirubin, Direct: 0.2 mg/dL (ref 0.00–0.40)
TOTAL PROTEIN: 7.1 g/dL (ref 6.0–8.5)

## 2017-09-22 LAB — SPECIMEN STATUS REPORT

## 2017-09-22 NOTE — Telephone Encounter (Signed)
Pt was seen by Deliah BostonMichael Clark PA yesterday and his glucose in office was 571. Per Dr Clelia CroftShaw I called the patient this morning to let him know he needed to come in and see us due to his elevated glucose and the pt stated he couldn't do that because he had to work and he felt fine, he didn't feel sick. [place] CMA relayed that message to Dr Clelia CroftShaw and she said the patient needed to go to the ER or come see us today as this can lead to DKA, but the patient still declined to come in he stated he will go to the ER later on today. Dr Clelia CroftShaw notified of this as well.

## 2017-09-30 ENCOUNTER — Ambulatory Visit: Payer: Self-pay

## 2017-09-30 ENCOUNTER — Ambulatory Visit: Payer: Self-pay | Admitting: Physician Assistant

## 2017-09-30 NOTE — Telephone Encounter (Signed)
Patient called in with c/o "high blood sugar." He says "since Monday, my blood sugar has not gotten below 300. I'm doing everything I'm supposed to do with my diet and taking my medicines, but nothing seems to help. It's making me feel week and tired. About 1 hour ago it was 319. I drink plenty of water because I stay thirsty." I asked what is his glucose levels fasting, he says "I work night shift, so by the time I check it's around 9-10 am and I don't consistently check it. But in the past it has been in the 100's and I felt great with those numbers." I asked has he missed any pills, he said "no, I take them once a day like it is on the bottle." He reports symptoms of dry mouth, thirsty, frequent urination. According to protocol, see PCP within 24 hours, appointment made for today with Deliah BostonMichael Clark, PA-C, care advice given, patient verbalized understanding.   Reason for Disposition . [1] Symptoms of high blood sugar (e.g., frequent urination, weak, weight loss) AND [2] not able to test blood glucose  Answer Assessment - Initial Assessment Questions 1. BLOOD GLUCOSE: "What is your blood glucose level?"      319-been 300's since Monday 2. ONSET: "When did you check the blood glucose?"     About 1 hour ago 3. USUAL RANGE: "What is your glucose level usually?" (e.g., usual fasting morning value, usual evening value)     Not really checking it  4. KETONES: "Do you check for ketones (urine or blood test strips)?" If yes, ask: "What does the test show now?"      N/A 5. TYPE 1 or 2:  "Do you know what type of diabetes you have?"  (e.g., Type 1, Type 2, Gestational; doesn't know)      Type 2 6. INSULIN: "Do you take insulin?" If yes, ask: "Have you missed any shots recently?"     No insulin 7. DIABETES PILLS: "Do you take any pills for your diabetes?" If yes, ask: "Have you missed taking any pills recently?"     Metformin and glypizide-Not missed any doses 8. OTHER SYMPTOMS: "Do you have any  symptoms?" (e.g., fever, frequent urination, difficulty breathing, dizziness, weakness, vomiting)     Frequent urination, thirsty, dry mouth, feels weak  9. PREGNANCY: "Is there any chance you are pregnant?" "When was your last menstrual period?"     N/A  Protocols used: DIABETES - HIGH BLOOD SUGAR-A-AH

## 2017-10-03 ENCOUNTER — Ambulatory Visit: Payer: BLUE CROSS/BLUE SHIELD | Admitting: Physician Assistant

## 2017-10-05 ENCOUNTER — Ambulatory Visit: Payer: BLUE CROSS/BLUE SHIELD | Admitting: Physician Assistant

## 2017-12-19 ENCOUNTER — Ambulatory Visit: Payer: BLUE CROSS/BLUE SHIELD | Admitting: Physician Assistant

## 2017-12-22 ENCOUNTER — Encounter: Payer: Self-pay | Admitting: Family Medicine

## 2017-12-22 ENCOUNTER — Other Ambulatory Visit: Payer: Self-pay

## 2017-12-22 ENCOUNTER — Ambulatory Visit: Payer: BLUE CROSS/BLUE SHIELD | Admitting: Family Medicine

## 2017-12-22 VITALS — BP 154/104 | HR 75 | Temp 98.3°F | Resp 16 | Ht 71.0 in | Wt 249.2 lb

## 2017-12-22 DIAGNOSIS — I10 Essential (primary) hypertension: Secondary | ICD-10-CM | POA: Diagnosis not present

## 2017-12-22 DIAGNOSIS — H1011 Acute atopic conjunctivitis, right eye: Secondary | ICD-10-CM | POA: Diagnosis not present

## 2017-12-22 MED ORDER — OLOPATADINE HCL 0.2 % OP SOLN: 1.0000 [drp] | Freq: Every day | OPHTHALMIC | 0 refills | Status: DC

## 2017-12-22 MED ORDER — POLYMYXIN B-TRIMETHOPRIM 10000-0.1 UNIT/ML-% OP SOLN: 1.0000 [drp] | OPHTHALMIC | 0 refills | Status: DC

## 2017-12-22 NOTE — Patient Instructions (Signed)
Allergic Conjunctivitis, Adult      Allergic conjunctivitis is inflammation of the clear membrane that covers the white part of your eye and the inner surface of your eyelid (conjunctiva). The inflammation is caused by allergies. The blood vessels in the conjunctiva become inflamed and this causes the eyes to become red or pink. The eyes often feel itchy. Allergic conjunctivitis cannot be spread from one person to another person (is not contagious). What are the causes? This condition is caused by an allergic reaction. Common causes of an allergic reaction (allergens) include:  Outdoor allergens, such as: ? Pollen. ? Grass and weeds. ? Mold spores.  Indoor allergens, such as: ? Dust. ? Smoke. ? Mold. ? Pet dander. ? Animal hair.  What increases the risk? You may be more likely to develop this condition if you have a family history of allergies, such as:  Allergic rhinitis.  Bronchial asthma.  Atopic dermatitis.  What are the signs or symptoms? Symptoms of this condition include eyes that are:  Itchy.  Red.  Watery.  Puffy.  Your eyes may also:  Sting or burn.  Have clear drainage coming from them.  How is this diagnosed? This condition may be diagnosed by medical history and physical exam. If you have drainage from your eyes, it may be tested to rule out other causes of conjunctivitis. You may also need to see a health care provider who specializes in treating allergies (allergist) or eye conditions (ophthalmologist) for tests to confirm the diagnosis. You may have:  Skin tests to see which allergens are causing your symptoms. These tests involve pricking the skin with a tiny needle and exposing the skin to small amounts of potential allergens to see if your skin reacts.  Blood tests.  Tissue scrapings from your eyelid. These will be examined under a microscope.  How is this treated? Treatments for this condition may include:  Cold cloths (compresses) to  soothe itching and swelling.  Washing the face to remove allergens.  Eye drops. These may be prescription or over-the-counter. There are several different types. You may need to try different types to see which one works best for you. Your may need: ? Eye drops that block the allergic reaction (antihistamine). ? Eye drops that reduce swelling and irritation (anti-inflammatory). ? Steroid eye drops to lessen a severe reaction (vernal conjunctivitis).  Oral antihistamine medicines to reduce your allergic reaction. You may need these if eye drops do not help or are difficult to use.  Follow these instructions at home:  Avoid known allergens whenever possible.  Take or apply over-the-counter and prescription medicines only as told by your health care provider. These include any eye drops.  Apply a cool, clean washcloth to your eye for 10-20 minutes, 3-4 times a day.  Do not touch or rub your eyes.  Do not wear contact lenses until the inflammation is gone. Wear glasses instead.  Do not wear eye makeup until the inflammation is gone.  Keep all follow-up visits as told by your health care provider. This is important. Contact a health care provider if:  Your symptoms get worse or do not improve with treatment.  You have mild eye pain.  You have sensitivity to light.  You have spots or blisters on your eyes.  You have pus draining from your eye.  You have a fever. Get help right away if:  You have redness, swelling, or other symptoms in only one eye.  Your vision is blurred or you have   vision changes.  You have severe eye pain. This information is not intended to replace advice given to you by your health care provider. Make sure you discuss any questions you have with your health care provider. Document Released: 10/30/2002 Document Revised: 04/07/2016 Document Reviewed: 02/20/2016 Elsevier Interactive Patient Education  2018 Elsevier Inc.  

## 2017-12-22 NOTE — Progress Notes (Signed)
Chief Complaint  Patient presents with  . ? conjunctivitis or ? allergies    eyes puffy/red/ irritated yes x yesterday, white/beige discharge in eyes, started in right eye and moved to left eye.  Tried otc eye gtts seasonal allergy formula helped some  and took allegra d  helped some.    HPI   Conjunctivitis Patient reports onset of watery, itchy eyes that started 1 days ago He denies any trauma to the eyes  Both eyes are equally affected He denies visual changes  He reports that he started having symptoms and tried to use ALLEGRA otc which provided some relief His symptoms are relieved by He is most bothered by itching Sick contacts include none  He has a history of diabetes or autoimmune disorder No fevers, chills, nausea or vomiting     Past Medical History:  Diagnosis Date  . Cataract 2011   October and November  . Depression    Situational- since injury  . Diabetes mellitus without complication ([place])    Type II  . GERD (gastroesophageal reflux disease)   . Hypertension   . Seasonal allergies     Current Outpatient Medications  Medication Sig Dispense Refill  . glipiZIDE (GLUCOTROL) 5 MG tablet Take 1 tablet (5 mg total) by mouth daily before breakfast. 60 tablet 3  . ibuprofen (ADVIL,MOTRIN) 800 MG tablet Take 1 tablet (800 mg total) by mouth every 8 (eight) hours as needed for mild pain or moderate pain. 21 tablet 0  . loratadine (CLARITIN) 10 MG tablet Take 1 tablet (10 mg total) by mouth daily. 30 tablet 11  . metFORMIN (GLUMETZA) 1000 MG (MOD) 24 hr tablet Take 2 tablets (2,000 mg total) by mouth daily with breakfast. 60 tablet 3  . amLODipine (NORVASC) 5 MG tablet Take 1 tablet (5 mg total) by mouth daily. (Patient not taking: Reported on 12/22/2017) 90 tablet 3  . Blood Pressure Monitoring (BLOOD PRESSURE KIT) DEVI 1 kit by Does not apply route daily. (Patient not taking: Reported on 12/22/2017) 1 Device 0  . Ca Carbonate-Mag Hydroxide (ROLAIDS PO) Take 2  tablets by mouth daily as needed (indigestion).     . cephALEXin (KEFLEX) 500 MG capsule Take 1 capsule (500 mg total) by mouth 3 (three) times daily. (Patient not taking: Reported on 12/22/2017) 30 capsule 0  . Dulaglutide (TRULICITY) 1.5 OL/4.1CV SOPN Start 0.75 mg subcutaneous one time a week for two weeks.  Start 1.5 mg every week thereafter. (Patient not taking: Reported on 12/22/2017) 4 pen 2  . Olopatadine HCl 0.2 % SOLN Apply 1 drop to eye daily. 2.5 mL 0  . terbinafine (LAMISIL) 250 MG tablet Please take one a day x 7days, repeat every 4 weeks x 4 months (Patient not taking: Reported on 12/22/2017) 28 tablet 0  . trimethoprim-polymyxin b (POLYTRIM) ophthalmic solution Place 1 drop into the right eye every 4 (four) hours. 10 mL 0   No current facility-administered medications for this visit.     Allergies: No Known Allergies  Past Surgical History:  Procedure Laterality Date  . cyst removed left arm Left   . EYE SURGERY Bilateral    hamm  . HAMMER TOE SURGERY Right 2006  . SHOULDER ARTHROSCOPY WITH ROTATOR CUFF REPAIR AND SUBACROMIAL DECOMPRESSION Right 01/06/2017   Procedure: SHOULDER ARTHROSCOPY WITH ROTATOR CUFF REPAIR, SUBACROMIAL DECOMPRESSION;  Surgeon: Tania Ade, MD;  Location[place];  Service: Orthopedics;  Laterality: Right;  SHOULDER ARTHROSCOPY WITH ROTATOR CUFF REPAIR, SUBACROMIAL DECOMPRESSION, BICEPS TENOTOMY  Social History   Socioeconomic History  . Marital status: Married    Spouse name: Not on file  . Number of children: Not on file  . Years of education: Not on file  . Highest education level: Not on file  Occupational History  . Not on file  Social Needs  . Financial resource strain: Not on file  . Food insecurity:    Worry: Not on file    Inability: Not on file  . Transportation needs:    Medical: Not on file    Non-medical: Not on file  Tobacco Use  . Smoking status: Never Smoker  . Smokeless tobacco: Never Used  Substance and Sexual Activity   . Alcohol use: No    Alcohol/week: 0.0 oz  . Drug use: No  . Sexual activity: Not on file  Lifestyle  . Physical activity:    Days per week: Not on file    Minutes per session: Not on file  . Stress: Not on file  Relationships  . Social connections:    Talks on phone: Not on file    Gets together: Not on file    Attends religious service: Not on file    Active member of club or organization: Not on file    Attends meetings of clubs or organizations: Not on file    Relationship status: Not on file  Other Topics Concern  . Not on file  Social History Narrative   Exercise walking 2-3 times/week x 1 mile    Family History  Problem Relation Age of Onset  . Lupus Mother   . GER disease Sister      ROS Review of Systems See HPI Constitution: No fevers or chills No malaise No diaphoresis Skin: No rash or itching Eyes: no blurry vision, no double vision GU: no dysuria or hematuria Neuro: no dizziness or headaches all others reviewed and negative   Objective: Vitals:   12/22/17 1154  BP: (!) 154/104  Pulse: 75  Resp: 16  Temp: 98.3 F (36.8 C)  TempSrc: Oral  SpO2: 97%  Weight: 249 lb 3.2 oz (113 kg)  Height: '5\' 11"'  (1.803 m)    Physical Exam  General: alert, oriented, in NAD Head: normocephalic, atraumatic, no sinus tenderness Eyes: EOM intact, no scleral icterus  No conjunctival injection No foreign body Ears: TM clear bilaterally Nose: mucosa nonerythematous, nonedematous Throat: no pharyngeal exudate or erythema Lymph: no posterior auricular, submental or cervical lymph adenopathy Heart: normal rate, normal sinus rhythm, no murmurs Lungs: clear to auscultation bilaterally, no wheezing   Assessment and Plan Trevor Garcia was seen today for ? conjunctivitis or ? allergies.  Diagnoses and all orders for this visit:  Allergic conjunctivitis of right eye -     Olopatadine HCl 0.2 % SOLN; Apply 1 drop to eye daily. At this point he has allergic  conjunctivitis but it is possible to develop superimposed bacterial infection from scratching Gave polytrim and explained indications for use  -     trimethoprim-polymyxin b (POLYTRIM) ophthalmic solution; Place 1 drop into the right eye every 4 (four) hours.  Essential Hypertension Patient did not take his bp medication He should follow up with his PCP and resume his bp meds   [place]

## 2017-12-25 ENCOUNTER — Encounter: Payer: Self-pay | Admitting: Family Medicine

## 2017-12-31 ENCOUNTER — Encounter: Payer: Self-pay | Admitting: Physician Assistant

## 2017-12-31 ENCOUNTER — Ambulatory Visit: Payer: BLUE CROSS/BLUE SHIELD | Admitting: Physician Assistant

## 2017-12-31 ENCOUNTER — Other Ambulatory Visit: Payer: Self-pay

## 2017-12-31 VITALS — BP 126/82 | HR 96 | Temp 97.7°F | Resp 16 | Ht 70.0 in | Wt 246.6 lb

## 2017-12-31 DIAGNOSIS — Z9189 Other specified personal risk factors, not elsewhere classified: Secondary | ICD-10-CM

## 2017-12-31 DIAGNOSIS — E1165 Type 2 diabetes mellitus with hyperglycemia: Secondary | ICD-10-CM | POA: Diagnosis not present

## 2017-12-31 DIAGNOSIS — Z9114 Patient's other noncompliance with medication regimen: Secondary | ICD-10-CM

## 2017-12-31 LAB — GLUCOSE, POCT (MANUAL RESULT ENTRY): POC Glucose: 437 mg/dl — AB (ref 70–99)

## 2017-12-31 LAB — POCT URINALYSIS DIP (MANUAL ENTRY)
Bilirubin, UA: NEGATIVE
Glucose, UA: 500 mg/dL — AB
Ketones, POC UA: NEGATIVE mg/dL
Leukocytes, UA: NEGATIVE
NITRITE UA: NEGATIVE
PROTEIN UA: NEGATIVE mg/dL
RBC UA: NEGATIVE
SPEC GRAV UA: 1.015 (ref 1.010–1.025)
UROBILINOGEN UA: 0.2 U/dL
pH, UA: 6 (ref 5.0–8.0)

## 2017-12-31 LAB — POCT GLYCOSYLATED HEMOGLOBIN (HGB A1C)

## 2017-12-31 NOTE — Addendum Note (Signed)
Addended by: Rogelia Rohrer on: 12/31/2017 12:17 PM   Modules accepted: Orders

## 2017-12-31 NOTE — Patient Instructions (Addendum)
Please find another health care provider. You will receive a discharge letter from the practice in the mail.  I wish you the very best.      IF you received an x-ray today, you will receive an invoice from [facility]. Please contact [facility] at [phone] with questions or concerns regarding your invoice.   IF you received labwork today, you will receive an invoice from [facility]. Please contact [facility] at [phone] with questions or concerns regarding your invoice.   Our billing staff will not be able to assist you with questions regarding bills from these companies.  You will be contacted with the lab results as soon as they are available. The fastest way to get your results is to activate your My Chart account. Instructions are located on the last page of this paperwork. If you have not heard from [place] regarding the results in 2 weeks, please contact this office.

## 2017-12-31 NOTE — Addendum Note (Signed)
Addended by: Rogelia Rohrer on: 12/31/2017 12:22 PM   Modules accepted: Orders

## 2017-12-31 NOTE — Progress Notes (Addendum)
12/31/2017 1:07 PM   DOB: [birth year] / MRN: [id number]  SUBJECTIVE:  Trevor Garcia is a 47 y.o. male with a history of medication non compliance returning for Diabetes recheck.  His diabetes was completely uncontrolled.  I was called by [facility] early in the morning hours with a glucose of greater than 500.  I called the patient that morning and advised that he come back to the clinic and he refused.  He was thus directed to the emergency care department where he did not present.  Today nursing staff tells me that he is not taking his metformin.  He is taking ginger and cinnamon to try to control his diabetes. He feels well today and denies complaint. In trying to reason with him today he tells me "I ain't going to take that medicine, and you need to come up with something else for me."    He has No Known Allergies.   He  has a past medical history of Cataract (2011), Depression, Diabetes mellitus without complication (HCC), GERD (gastroesophageal reflux disease), Hypertension, and Seasonal allergies.    He  reports that he has never smoked. He has never used smokeless tobacco. He reports that he does not drink alcohol or use drugs. He  has no sexual activity history on file. The patient  has a past surgical history that includes Hammer toe surgery (Right, 2006); Eye surgery (Bilateral); cyst removed left arm (Left); and Shoulder arthroscopy with rotator cuff repair and subacromial decompression (Right, 01/06/2017).  His family history includes GER disease in his sister; Lupus in his mother.  Review of Systems  Constitutional: Negative for chills, diaphoresis and fever.  Eyes: Negative.   Respiratory: Negative for cough, hemoptysis, sputum production, shortness of breath and wheezing.   Cardiovascular: Negative for chest pain, orthopnea and leg swelling.  Gastrointestinal: Negative for abdominal pain, blood in stool, constipation, diarrhea, heartburn, melena, nausea and vomiting.  Genitourinary:  Negative for dysuria, flank pain, frequency, hematuria and urgency.  Skin: Negative for rash.  Neurological: Negative for dizziness, sensory change, speech change, focal weakness and headaches.    The problem list and medications were reviewed and updated by myself where necessary and exist elsewhere in the encounter.   OBJECTIVE:  BP 126/82   Pulse 96   Temp 97.7 F (36.5 C)   Resp 16   Ht  (1.778 m)   Wt 246 lb 9.6 oz (111.9 kg)   SpO2 97%   BMI 35.38 kg/m   Wt Readings from Last 3 Encounters:  12/31/17 246 lb 9.6 oz (111.9 kg)  12/22/17 249 lb 3.2 oz (113 kg)  09/21/17 273 lb (123.8 kg)   Temp Readings from Last 3 Encounters:  12/31/17 97.7 F (36.5 C)  12/22/17 98.3 F (36.8 C) (Oral)  09/21/17 97.9 F (36.6 C) (Oral)   BP Readings from Last 3 Encounters:  12/31/17 126/82  12/22/17 (!) 154/104  09/21/17 130/80   Pulse Readings from Last 3 Encounters:  12/31/17 96  12/22/17 75  09/21/17 82  1   Lab Results  Component Value Date   HGBA1C >14.0 12/31/2017     Physical Exam  Constitutional: He is oriented to person, place, and time. He appears well-developed. He is active and cooperative. He does not appear ill.  Eyes: Pupils are equal, round, and reactive to light. Conjunctivae and EOM are normal.  Cardiovascular: Normal rate, regular rhythm, S1 normal, S2 normal, normal heart sounds, intact distal pulses and normal pulses. Exam reveals  no gallop and no friction rub.  No murmur heard. Pulmonary/Chest: Effort normal and breath sounds normal. No stridor. No tachypnea. No respiratory distress. He has no wheezes. He has no rales. He exhibits no tenderness.  Abdominal: He exhibits no distension.  Musculoskeletal: Normal range of motion. He exhibits no edema.  Neurological: He is alert and oriented to person, place, and time. No cranial nerve deficit. Coordination normal.  Skin: Skin is warm and dry. He is not diaphoretic.  Psychiatric: His mood appears  not anxious. He is not hyperactive and not combative. He does not exhibit a depressed mood.  He is in denial  Nursing note and vitals reviewed.   Results for orders placed or performed in visit on 12/31/17 (from the past 72 hour(s))  POCT glucose (manual entry)     Status: Abnormal   Collection Time: 12/31/17 11:15 AM  Result Value Ref Range   POC Glucose 437 (A) 70 - 99 mg/dl  POCT glycosylated hemoglobin (Hb A1C)     Status: None   Collection Time: 12/31/17 11:19 AM  Result Value Ref Range   Hemoglobin A1C >14.0   POCT urinalysis dipstick     Status: Abnormal   Collection Time: 12/31/17 12:17 PM  Result Value Ref Range   Color, UA yellow yellow   Clarity, UA clear clear   Glucose, UA =500 (A) negative mg/dL   Bilirubin, UA negative negative   Ketones, POC UA negative negative mg/dL   Spec Grav, UA 2.841 3.244 - 1.025   Blood, UA negative negative   pH, UA 6.0 5.0 - 8.0   Protein Ur, POC negative negative mg/dL   Urobilinogen, UA 0.2 0.2 or 1.0 E.U./dL   Nitrite, UA Negative Negative   Leukocytes, UA Negative Negative    No results found.  ASSESSMENT AND PLAN:  Yasseen was seen today for hypertension.  Diagnoses and all orders for this visit:  Uncontrolled type 2 diabetes mellitus with hyperglycemia [facility]): Patient refuses to take medication. I have tried to get through to him today but he refuses.  He has refused to follow my advise in the past.   He has normal vitals signs at this time and denies polydipsia and polyuria at this time. He denies chest pain and SOB and tells me "I feel great right now."   Tells me that if I refer him he will continue to avoid medications.  I have advised that he is at risk of amputation, vision, loss, renal failure, ASCVD.  He tells me that he understands that and says "that could happen, but it could also happen if I take the medications."   He tells me that I need to come up with another plan for him. I have advised that he find another medical  practice to frequent. He is in agreement with the plan.  I am forwarding this note to my medical director for discharge from the practice. -     POCT glycosylated hemoglobin (Hb A1C) -     Cancel: Glucose (CBG) -     Microalbumin, urine -     POCT glucose (manual entry)  Non compliance w medication regimen  At risk for cardiac complication    The patient is advised to call or return to clinic if he does not see an improvement in symptoms, or to seek the care of the closest emergency department if he worsens with the above plan.   Deliah Boston, MHS, PA-C Primary [facility] at [facility] 12/31/2017  1:07 PM

## 2018-01-01 LAB — MICROALBUMIN, URINE: MICROALBUM., U, RANDOM: 7.5 ug/mL

## 2018-03-30 ENCOUNTER — Ambulatory Visit: Payer: Self-pay | Admitting: Family Medicine

## 2018-03-31 ENCOUNTER — Ambulatory Visit: Payer: Self-pay | Admitting: Family Medicine

## 2018-09-05 ENCOUNTER — Ambulatory Visit: Payer: BLUE CROSS/BLUE SHIELD | Admitting: Family Medicine

## 2018-09-05 ENCOUNTER — Other Ambulatory Visit: Payer: Self-pay

## 2018-09-05 ENCOUNTER — Encounter: Payer: Self-pay | Admitting: Family Medicine

## 2018-09-05 VITALS — BP 134/91 | HR 63 | Temp 98.6°F | Resp 18 | Ht 70.0 in | Wt 224.2 lb

## 2018-09-05 DIAGNOSIS — E1165 Type 2 diabetes mellitus with hyperglycemia: Secondary | ICD-10-CM

## 2018-09-05 DIAGNOSIS — H6122 Impacted cerumen, left ear: Secondary | ICD-10-CM | POA: Diagnosis not present

## 2018-09-05 DIAGNOSIS — I10 Essential (primary) hypertension: Secondary | ICD-10-CM | POA: Diagnosis not present

## 2018-09-05 DIAGNOSIS — R634 Abnormal weight loss: Secondary | ICD-10-CM

## 2018-09-05 LAB — POCT GLYCOSYLATED HEMOGLOBIN (HGB A1C): Hemoglobin A1C: 14 % — AB (ref 4.0–5.6)

## 2018-09-05 LAB — POCT CBC
Granulocyte percent: 69.1 %G (ref 37–80)
HCT, POC: 46.2 % — AB (ref 29–41)
HEMOGLOBIN: 15.1 g/dL — AB (ref 11–14.6)
Lymph, poc: 2.3 (ref 0.6–3.4)
MCH: 27.9 pg (ref 27–31.2)
MCHC: 32.8 g/dL (ref 31.8–35.4)
MCV: 85.1 fL (ref 76–111)
MID (cbc): 0.5 (ref 0–0.9)
MPV: 9.2 fL (ref 0–99.8)
POC Granulocyte: 6.3 (ref 2–6.9)
POC LYMPH PERCENT: 25.8 %L (ref 10–50)
POC MID %: 5.1 % (ref 0–12)
Platelet Count, POC: 284 10*3/uL (ref 142–424)
RBC: 5.43 M/uL (ref 4.69–6.13)
RDW, POC: 13.7 %
WBC: 9.1 10*3/uL (ref 4.6–10.2)

## 2018-09-05 LAB — POCT URINALYSIS DIP (MANUAL ENTRY)
BILIRUBIN UA: NEGATIVE
Blood, UA: NEGATIVE
Ketones, POC UA: NEGATIVE mg/dL
Leukocytes, UA: NEGATIVE
Nitrite, UA: NEGATIVE
PH UA: 6 (ref 5.0–8.0)
Protein Ur, POC: NEGATIVE mg/dL
Spec Grav, UA: 1.01 (ref 1.010–1.025)
Urobilinogen, UA: 0.2 E.U./dL

## 2018-09-05 MED ORDER — LISINOPRIL 10 MG PO TABS: 10.0000 mg | ORAL_TABLET | Freq: Every day | ORAL | 3 refills | Status: AC

## 2018-09-05 MED ORDER — SITAGLIP PHOS-METFORMIN HCL ER 50-1000 MG PO TB24: 1.0000 | ORAL_TABLET | Freq: Every day | ORAL | 1 refills | Status: AC

## 2018-09-05 NOTE — Progress Notes (Signed)
Established Patient Office Visit  Subjective:  Patient ID: Trevor Garcia, male    DOB: 12/23/1970  Age: 48 y.o. MRN: [id number]  CC:  Chief Complaint  Patient presents with  . Ear Pain    left ear feels clogged   . Cough    HPI DANON LOGRASSO presents for   Impacted cerumen he reports that she feels like he has some clogging of the left ear he also had a cough  Diabetes Mellitus: Patient presents for follow up of diabetes. Symptoms: fatigue. Symptoms have stabilized. Patient denies nausea, paresthesia of the feet and polydipsia.  Evaluation to date has been included: hemoglobin A1C.  Home sugars: patient does not check sugars.  He admist that he was not taking his medications because he wanted to lose weigt He states that he lost weight and wanted to get it down on his own Lab Results  Component Value Date   HGBA1C 14.0 (A) 09/05/2018   Wt Readings from Last 3 Encounters:  09/05/18 224 lb 3.2 oz (101.7 kg)  12/31/17 246 lb 9.6 oz (111.9 kg)  12/22/17 249 lb 3.2 oz (113 kg)   Hypertension: Patient here for follow-up of elevated blood pressure. He is exercising and is adherent to low salt diet.  Blood pressure is not monitored at home. Cardiac symptoms none. Patient denies chest pain, chest pressure/discomfort, claudication, dyspnea, exertional chest pressure/discomfort, irregular heart beat and lower extremity edema.  Cardiovascular risk factors: diabetes mellitus, hypertension, male gender and obesity (BMI >= 30 kg/m2). Use of agents associated with hypertension: none. History of target organ damage: none. BP Readings from Last 3 Encounters:  09/05/18 (!) 134/91  12/31/17 126/82  12/22/17 (!) 154/104      Past Medical History:  Diagnosis Date  . Cataract 2011   October and November  . Depression    Situational- since injury  . Diabetes mellitus without complication ([place])    Type II  . GERD (gastroesophageal reflux disease)   . Hypertension   . Seasonal allergies      Past Surgical History:  Procedure Laterality Date  . cyst removed left arm Left   . EYE SURGERY Bilateral    hamm  . HAMMER TOE SURGERY Right 2006  . SHOULDER ARTHROSCOPY WITH ROTATOR CUFF REPAIR AND SUBACROMIAL DECOMPRESSION Right 01/06/2017   Procedure: SHOULDER ARTHROSCOPY WITH ROTATOR CUFF REPAIR, SUBACROMIAL DECOMPRESSION;  Surgeon: Tania Ade, MD;  Location: [place];  Service: Orthopedics;  Laterality: Right;  SHOULDER ARTHROSCOPY WITH ROTATOR CUFF REPAIR, SUBACROMIAL DECOMPRESSION, BICEPS TENOTOMY    Family History  Problem Relation Age of Onset  . Lupus Mother   . GER disease Sister     Social History   Socioeconomic History  . Marital status: Married    Spouse name: Not on file  . Number of children: Not on file  . Years of education: Not on file  . Highest education level: Not on file  Occupational History  . Not on file  Social Needs  . Financial resource strain: Not on file  . Food insecurity:    Worry: Not on file    Inability: Not on file  . Transportation needs:    Medical: Not on file    Non-medical: Not on file  Tobacco Use  . Smoking status: Never Smoker  . Smokeless tobacco: Never Used  Substance and Sexual Activity  . Alcohol use: No    Alcohol/week: 0.0 standard drinks  . Drug use: No  . Sexual activity: Not on  file  Lifestyle  . Physical activity:    Days per week: Not on file    Minutes per session: Not on file  . Stress: Not on file  Relationships  . Social connections:    Talks on phone: Not on file    Gets together: Not on file    Attends religious service: Not on file    Active member of club or organization: Not on file    Attends meetings of clubs or organizations: Not on file    Relationship status: Not on file  . Intimate partner violence:    Fear of current or ex partner: Not on file    Emotionally abused: Not on file    Physically abused: Not on file    Forced sexual activity: Not on file  Other Topics Concern  .  Not on file  Social History Narrative   Exercise walking 2-3 times/week x 1 mile    Outpatient Medications Prior to Visit  Medication Sig Dispense Refill  . albuterol (PROVENTIL HFA;VENTOLIN HFA) 108 (90 Base) MCG/ACT inhaler Inhale into the lungs every 6 (six) hours as needed for wheezing or shortness of breath.    . benzonatate (TESSALON) 100 MG capsule Take by mouth 3 (three) times daily as needed for cough.    Marland Kitchen CINNAMON PO Take by mouth daily.    . Ginger, Zingiber officinalis, (GINGER ROOT PO) Take by mouth daily.    . phenylephrine (SUDAFED PE) 10 MG TABS tablet Take 10 mg by mouth every 4 (four) hours as needed.    . Dulaglutide (TRULICITY) 1.5 ER/1.5QM SOPN Start 0.75 mg subcutaneous one time a week for two weeks.  Start 1.5 mg every week thereafter. (Patient not taking: Reported on 12/22/2017) 4 pen 2  . ibuprofen (ADVIL,MOTRIN) 800 MG tablet Take 1 tablet (800 mg total) by mouth every 8 (eight) hours as needed for mild pain or moderate pain. (Patient not taking: Reported on 09/05/2018) 21 tablet 0   No facility-administered medications prior to visit.     No Known Allergies  ROS Review of Systems Review of Systems  Constitutional: Negative for activity change, appetite change, chills and fever.  HENT: Negative for congestion, nosebleeds, trouble swallowing and voice change.   Respiratory: Negative for cough, shortness of breath and wheezing.   Gastrointestinal: Negative for diarrhea, nausea and vomiting.  Genitourinary: Negative for difficulty urinating, dysuria, flank pain and hematuria.  Musculoskeletal: Negative for back pain, joint swelling and neck pain.  Neurological: Negative for dizziness, speech difficulty, light-headedness and numbness.  See HPI. All other review of systems negative.     Objective:    Physical Exam  BP (!) 134/91   Pulse 63   Temp 98.6 F (37 C) (Oral)   Resp 18   Ht '5\' 10"'  (1.778 m)   Wt 224 lb 3.2 oz (101.7 kg)   SpO2 94%   BMI 32.17  kg/m  Wt Readings from Last 3 Encounters:  09/05/18 224 lb 3.2 oz (101.7 kg)  12/31/17 246 lb 9.6 oz (111.9 kg)  12/22/17 249 lb 3.2 oz (113 kg)    Physical Exam  Constitutional: Oriented to person, place, and time. Appears well-developed and well-nourished.  HENT:  Head: Normocephalic and atraumatic.  Eyes: Conjunctivae and EOM are normal.  Cardiovascular: Normal rate, regular rhythm, normal heart sounds and intact distal pulses.  No murmur heard. Pulmonary/Chest: Effort normal and breath sounds normal. No stridor. No respiratory distress. Has no wheezes.  Neurological: Is alert and oriented  to person, place, and time.  Skin: Skin is warm. Capillary refill takes less than 2 seconds.  Psychiatric: Has a normal mood and affect. Behavior is normal. Judgment and thought content normal.   Health Maintenance Due  Topic Date Due  . OPHTHALMOLOGY EXAM  11/08/1979  . HIV Screening  11/07/1984    There are no preventive care reminders to display for this patient.  Lab Results  Component Value Date   TSH 0.914 09/11/2015   Lab Results  Component Value Date   WBC 9.1 09/05/2018   HGB 15.1 (A) 09/05/2018   HCT 46.2 (A) 09/05/2018   MCV 85.1 09/05/2018   PLT WILL FOLLOW 09/22/2017   Lab Results  Component Value Date   NA 133 (L) 09/05/2018   K 4.5 09/05/2018   CO2 21 09/05/2018   GLUCOSE 503 (HH) 09/05/2018   BUN 9 09/05/2018   CREATININE 0.87 09/05/2018   BILITOT 0.4 09/05/2018   ALKPHOS 123 (H) 09/05/2018   AST 14 09/05/2018   ALT 24 09/05/2018   PROT 6.7 09/05/2018   ALBUMIN 4.3 09/05/2018   CALCIUM 9.2 09/05/2018   ANIONGAP 12 01/03/2017   Lab Results  Component Value Date   CHOL 228 (H) 09/05/2018   Lab Results  Component Value Date   HDL 41 09/05/2018   Lab Results  Component Value Date   LDLCALC 151 (H) 09/05/2018   Lab Results  Component Value Date   TRIG 181 (H) 09/05/2018   Lab Results  Component Value Date   CHOLHDL 5.6 (H) 09/05/2018   Lab  Results  Component Value Date   HGBA1C 14.0 (A) 09/05/2018      Assessment & Plan:   Problem List Items Addressed This Visit      Cardiovascular and Mediastinum   Essential hypertension  -  Patient's blood pressure is at goal of 139/89 or less. Condition is stable. Continue current medications and treatment plan. I recommend that you exercise for 30-45 minutes 5 days a week. I also recommend a balanced diet with fruits and vegetables every day, lean meats, and little fried foods. The DASH diet (you can find this online) is a good example of this.    Relevant Medications   lisinopril (PRINIVIL,ZESTRIL) 10 MG tablet    Other Visit Diagnoses    Uncontrolled type 2 diabetes mellitus with hyperglycemia ([place])    -  Primary  Diabetes uncontrolled a1c 14 Patient previously noncompliant  Will follow closely and refer to endocrinology if he continues to have persistent out of control a1c     Relevant Medications   lisinopril (PRINIVIL,ZESTRIL) 10 MG tablet   SitaGLIPtin-MetFORMIN HCl 50-1000 MG TB24   Other Relevant Orders   POCT glycosylated hemoglobin (Hb A1C) (Completed)   Lipid panel (Completed)   CMP14+EGFR (Completed)   POCT CBC (Completed)   Microalbumin, urine (Completed)   POCT urinalysis dipstick (Completed)   Loss of weight    - discussed that weight loss can occur due to uncontrolled diabetes and that his weight loss does not necessarily mean he is losing weight in a healthy way    Relevant Orders   POCT glycosylated hemoglobin (Hb A1C) (Completed)   Impacted cerumen of left ear    - resolved with lavage   Relevant Orders   Ear wax removal      Meds ordered this encounter  Medications  . lisinopril (PRINIVIL,ZESTRIL) 10 MG tablet    Sig: Take 1 tablet (10 mg total) by mouth daily.  Dispense:  90 tablet    Refill:  3  . SitaGLIPtin-MetFORMIN HCl 50-1000 MG TB24    Sig: Take 1 tablet by mouth daily with breakfast.    Dispense:  30 tablet    Refill:  1     Follow-up: Return in about 4 weeks (around 10/03/2018).    Forrest Moron, MD

## 2018-09-05 NOTE — Patient Instructions (Addendum)
Since your sugars are so high Your current levels of your glucose are 400-500 We need to get your levels down to 90-140 fasting If you notice that your sugars are in the 200s and you feel low sugar symptoms such as sweats, shakes or confusion eat some string cheese or drink milk. This is because your body thinks it is going through hypoglycemia  Eat your last meal at 3am before you come in for a 10am blood draw Follow up 10/04/2018 for a same day appointment at 11am.   If you have lab work done today you will be contacted with your lab results within the next 2 weeks.  If you have not heard from us then please contact us. The fastest way to get your results is to register for My Chart.   IF you received an x-ray today, you will receive an invoice from [facility]. Please contact [facility] at [phone] with questions or concerns regarding your invoice.   IF you received labwork today, you will receive an invoice from [facility]. Please contact [facility] at [phone] with questions or concerns regarding your invoice.   Our billing staff will not be able to assist you with questions regarding bills from these companies.  You will be contacted with the lab results as soon as they are available. The fastest way to get your results is to activate your My Chart account. Instructions are located on the last page of this paperwork. If you have not heard from us regarding the results in 2 weeks, please contact this office.     Blood Glucose Monitoring, Adult Monitoring your blood sugar (glucose) is an important part of managing your diabetes (diabetes mellitus). Blood glucose monitoring involves checking your blood glucose as often as directed and keeping a record (log) of your results over time. Checking your blood glucose regularly and keeping a blood glucose log can:  Help you and your health care provider adjust your diabetes management plan as needed, including your  medicines or insulin.  Help you understand how food, exercise, illnesses, and medicines affect your blood glucose.  Let you know what your blood glucose is at any time. You can quickly find out if you have low blood glucose (hypoglycemia) or high blood glucose (hyperglycemia). Your health care provider will set individualized treatment goals for you. Your goals will be based on your age, other medical conditions you have, and how you respond to diabetes treatment. Generally, the goal of treatment is to maintain the following blood glucose levels:  Before meals (preprandial): 80-130 mg/dL (9.5-6.24.4-7.2 mmol/L).  After meals (postprandial): below 180 mg/dL (10 mmol/L).  A1c level: less than 7%. Supplies needed:  Blood glucose meter.  Test strips for your meter. Each meter has its own strips. You must use the strips that came with your meter.  A needle to prick your finger (lancet). Do not use a lancet more than one time.  A device that holds the lancet (lancing device).  A journal or log book to write down your results. How to check your blood glucose  1. Wash your hands with soap and water. 2. Prick the side of your finger (not the tip) with the lancet. Use a different finger each time. 3. Gently rub the finger until a small drop of blood appears. 4. Follow instructions that come with your meter for inserting the test strip, applying blood to the strip, and using your blood glucose meter. 5. Write down your result and any notes. Some meters  allow you to use areas of your body other than your finger (alternative sites) to test your blood. The most common alternative sites are:  Forearm.  Thigh.  Palm of the hand. If you think you may have hypoglycemia, or if you have a history of not knowing when your blood glucose is getting low (hypoglycemia unawareness), do not use alternative sites. Use your finger instead. Alternative sites may not be as accurate as the fingers, because blood flow  is slower in these areas. This means that the result you get may be delayed, and it may be different from the result that you would get from your finger. Follow these instructions at home: Blood glucose log   Every time you check your blood glucose, write down your result. Also write down any notes about things that may be affecting your blood glucose, such as your diet and exercise for the day. This information can help you and your health care provider: ? Look for patterns in your blood glucose over time. ? Adjust your diabetes management plan as needed.  Check if your meter allows you to download your records to a computer. Most glucose meters store a record of glucose readings in the meter. If you have type 1 diabetes:  Check your blood glucose 2 or more times a day.  Also check your blood glucose: ? Before every insulin injection. ? Before and after exercise. ? Before meals. ? 2 hours after a meal. ? Occasionally between 2:00 a.m. and 3:00 a.m., as directed. ? Before potentially dangerous tasks, like driving or using heavy machinery. ? At bedtime.  You may need to check your blood glucose more often, up to 6-10 times a day, if you: ? Use an insulin pump. ? Need multiple daily injections (MDI). ? Have diabetes that is not well-controlled. ? Are ill. ? Have a history of severe hypoglycemia. ? Have hypoglycemia unawareness. If you have type 2 diabetes:  If you take insulin or other diabetes medicines, check your blood glucose 2 or more times a day.  If you are on intensive insulin therapy, check your blood glucose 4 or more times a day. Occasionally, you may also need to check between 2:00 a.m. and 3:00 a.m., as directed.  Also check your blood glucose: ? Before and after exercise. ? Before potentially dangerous tasks, like driving or using heavy machinery.  You may need to check your blood glucose more often if: ? Your medicine is being adjusted. ? Your diabetes is not  well-controlled. ? You are ill. General tips  Always keep your supplies with you.  If you have questions or need help, all blood glucose meters have a 24-hour "hotline" phone number that you can call. You may also contact your health care provider.  After you use a few boxes of test strips, adjust (calibrate) your blood glucose meter by following instructions that came with your meter. Contact a health care provider if:  Your blood glucose is at or above 240 mg/dL (80.0 mmol/L) for 2 days in a row.  You have been sick or have had a fever for 2 days or longer, and you are not getting better.  You have any of the following problems for more than 6 hours: ? You cannot eat or drink. ? You have nausea or vomiting. ? You have diarrhea. Get help right away if:  Your blood glucose is lower than 54 mg/dL (3 mmol/L).  You become confused or you have trouble thinking clearly.  You  have difficulty breathing.  You have moderate or large ketone levels in your urine. Summary  Monitoring your blood sugar (glucose) is an important part of managing your diabetes (diabetes mellitus).  Blood glucose monitoring involves checking your blood glucose as often as directed and keeping a record (log) of your results over time.  Your health care provider will set individualized treatment goals for you. Your goals will be based on your age, other medical conditions you have, and how you respond to diabetes treatment.  Every time you check your blood glucose, write down your result. Also write down any notes about things that may be affecting your blood glucose, such as your diet and exercise for the day. This information is not intended to replace advice given to you by your health care provider. Make sure you discuss any questions you have with your health care provider. Document Released: 08/12/2003 Document Revised: 06/20/2017 Document Reviewed: 01/19/2016 Elsevier Interactive Patient Education  2019  [employer].

## 2018-09-06 LAB — CMP14+EGFR
ALBUMIN: 4.3 g/dL (ref 3.5–5.5)
ALT: 24 IU/L (ref 0–44)
AST: 14 IU/L (ref 0–40)
Albumin/Globulin Ratio: 1.8 (ref 1.2–2.2)
Alkaline Phosphatase: 123 IU/L — ABNORMAL HIGH (ref 39–117)
BUN/Creatinine Ratio: 10 (ref 9–20)
BUN: 9 mg/dL (ref 6–24)
Bilirubin Total: 0.4 mg/dL (ref 0.0–1.2)
CO2: 21 mmol/L (ref 20–29)
Calcium: 9.2 mg/dL (ref 8.7–10.2)
Chloride: 95 mmol/L — ABNORMAL LOW (ref 96–106)
Creatinine, Ser: 0.87 mg/dL (ref 0.76–1.27)
GFR calc Af Amer: 118 mL/min/{1.73_m2} (ref >59)
GFR calc non Af Amer: 102 mL/min/{1.73_m2} (ref >59)
GLUCOSE: 503 mg/dL — AB (ref 65–99)
Globulin, Total: 2.4 g/dL (ref 1.5–4.5)
Potassium: 4.5 mmol/L (ref 3.5–5.2)
Sodium: 133 mmol/L — ABNORMAL LOW (ref 134–144)
Total Protein: 6.7 g/dL (ref 6.0–8.5)

## 2018-09-06 LAB — LIPID PANEL
Chol/HDL Ratio: 5.6 ratio — ABNORMAL HIGH (ref 0.0–5.0)
Cholesterol, Total: 228 mg/dL — ABNORMAL HIGH (ref 100–199)
HDL: 41 mg/dL (ref >39)
LDL Calculated: 151 mg/dL — ABNORMAL HIGH (ref 0–99)
Triglycerides: 181 mg/dL — ABNORMAL HIGH (ref 0–149)
VLDL Cholesterol Cal: 36 mg/dL (ref 5–40)

## 2018-09-06 LAB — MICROALBUMIN, URINE: Microalbumin, Urine: 6.8 ug/mL

## 2018-10-04 ENCOUNTER — Ambulatory Visit: Payer: BLUE CROSS/BLUE SHIELD | Admitting: Family Medicine

## 2019-04-04 ENCOUNTER — Encounter (HOSPITAL_COMMUNITY): Payer: Self-pay | Admitting: Emergency Medicine

## 2019-04-04 ENCOUNTER — Ambulatory Visit (HOSPITAL_COMMUNITY)
Admission: EM | Admit: 2019-04-04 | Discharge: 2019-04-04 | Disposition: A | Discharge status: Home / Self Care | Payer: BC Managed Care – PPO | Attending: Family Medicine | Admitting: Family Medicine

## 2019-04-04 ENCOUNTER — Other Ambulatory Visit: Payer: Self-pay

## 2019-04-04 DIAGNOSIS — J029 Acute pharyngitis, unspecified: Secondary | ICD-10-CM | POA: Insufficient documentation

## 2019-04-04 LAB — POCT RAPID STREP A: Streptococcus, Group A Screen (Direct): NEGATIVE

## 2019-04-04 NOTE — Discharge Instructions (Signed)
You may use over the counter ibuprofen or acetaminophen as needed.  For a sore throat, over the counter products such as [employer] Peroxyl Mouth Sore Rinse or Chloraseptic Sore Throat Spray may provide some temporary relief. Your rapid strep test was negative today. We have sent your throat swab for culture and will let you know of any positive results. 

## 2019-04-04 NOTE — ED Triage Notes (Signed)
Pt here with sore throat; pt sts worked with someone who has strep throat recently

## 2019-04-06 IMAGING — CR DG HIP (WITH OR WITHOUT PELVIS) 2-3V*R*
3 series · 3 of 3 positions shown · non-contrast
Comparison: None.

CLINICAL DATA: Slip and fall with right hip pain, initial encounter

EXAM:
DG HIP (WITH OR WITHOUT PELVIS) 3V RIGHT

[pelvis ap]
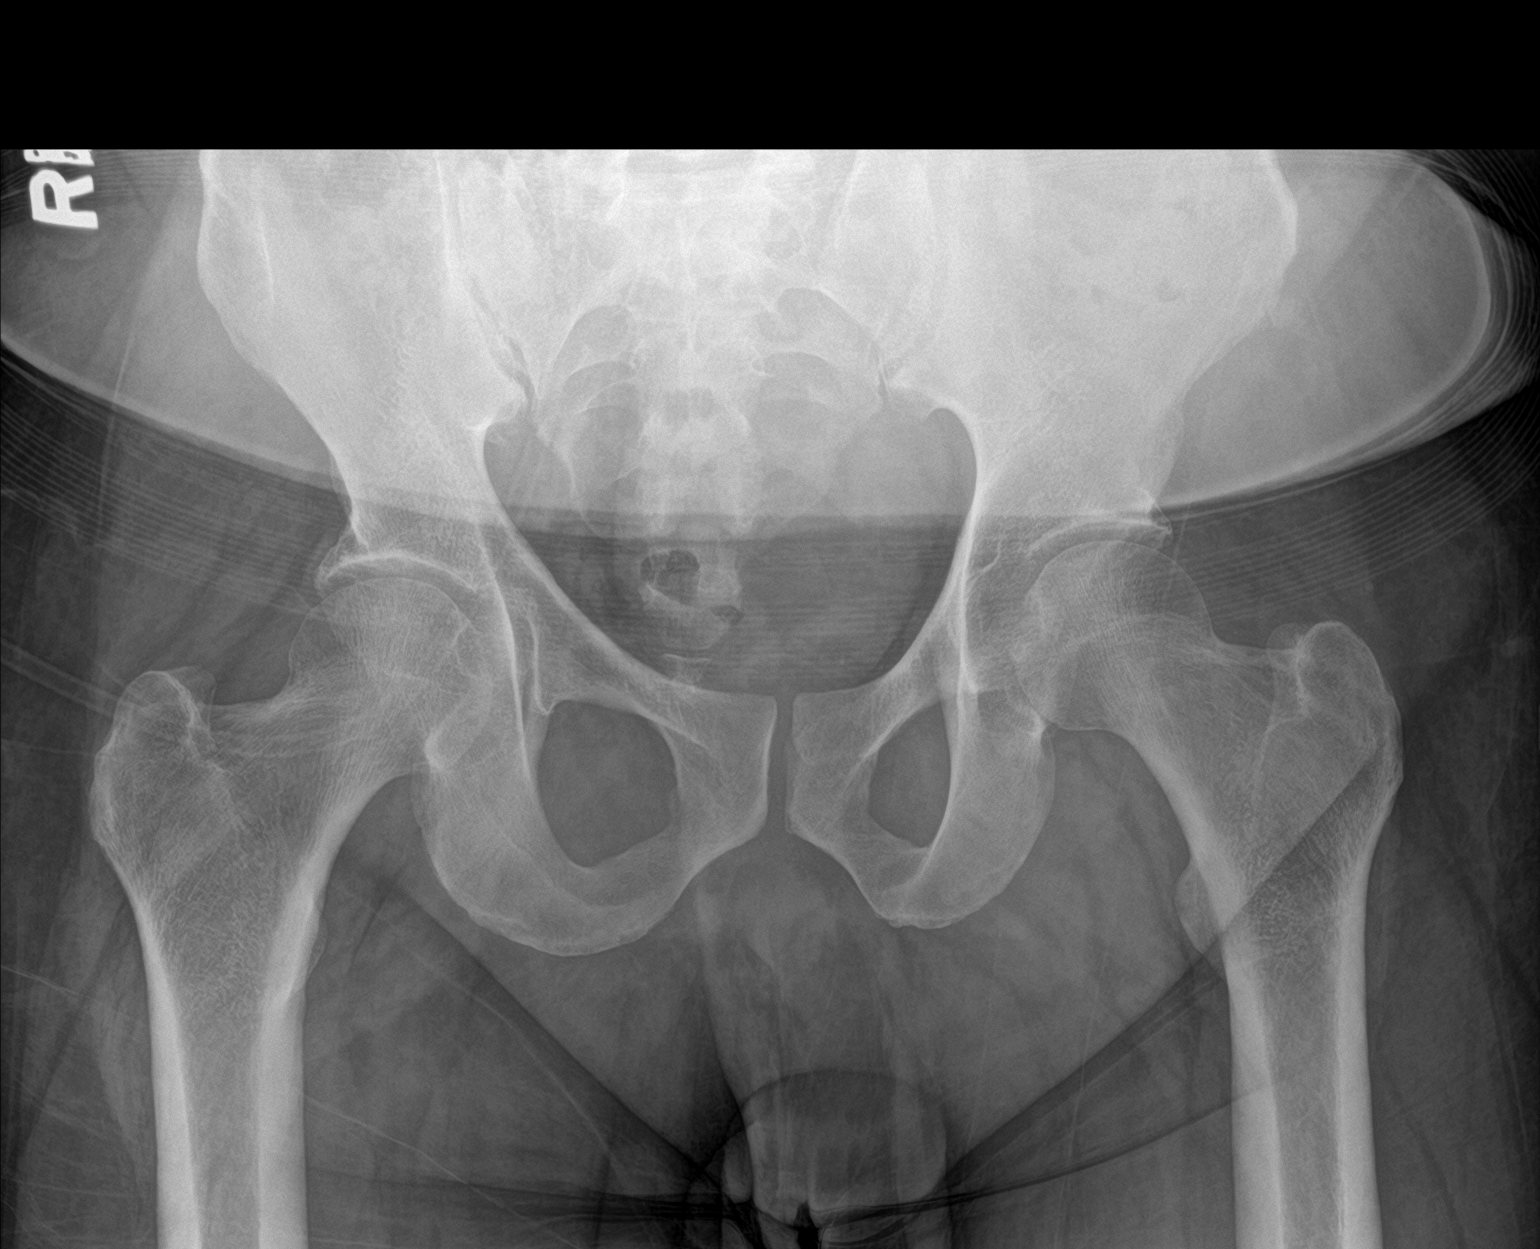

[hip ap]
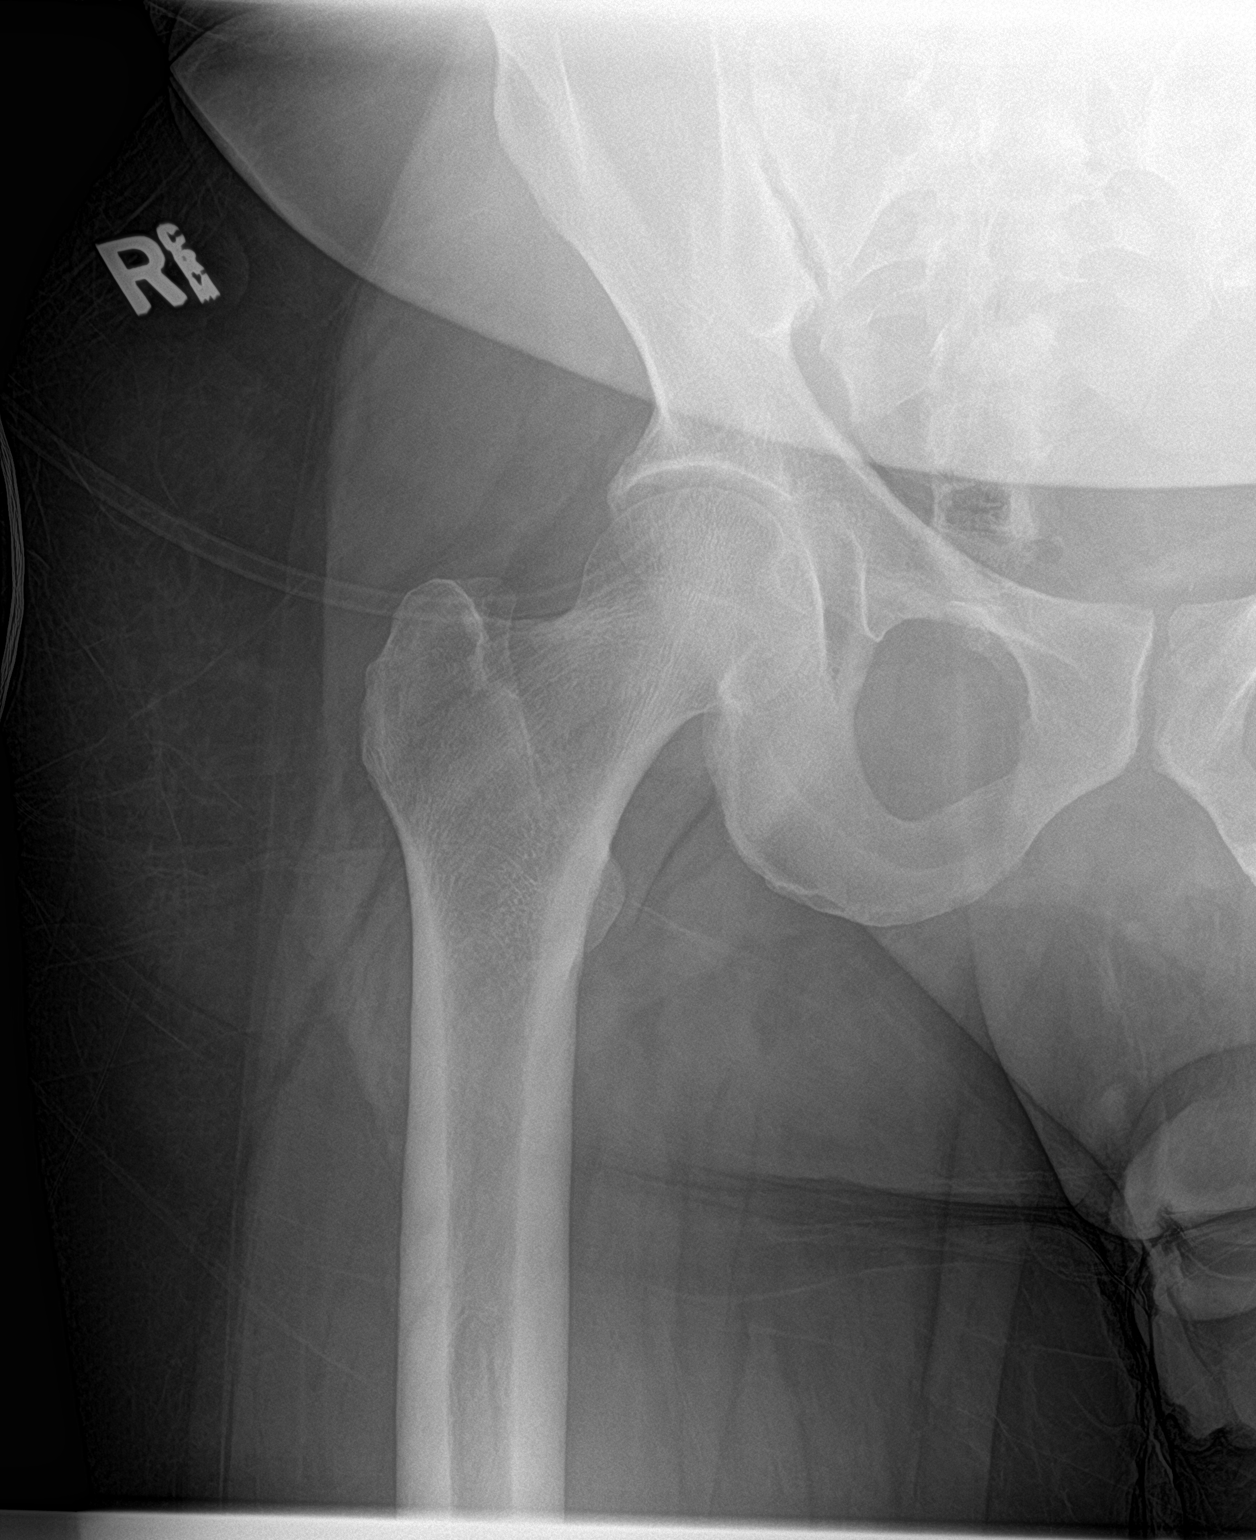

[hip lat]
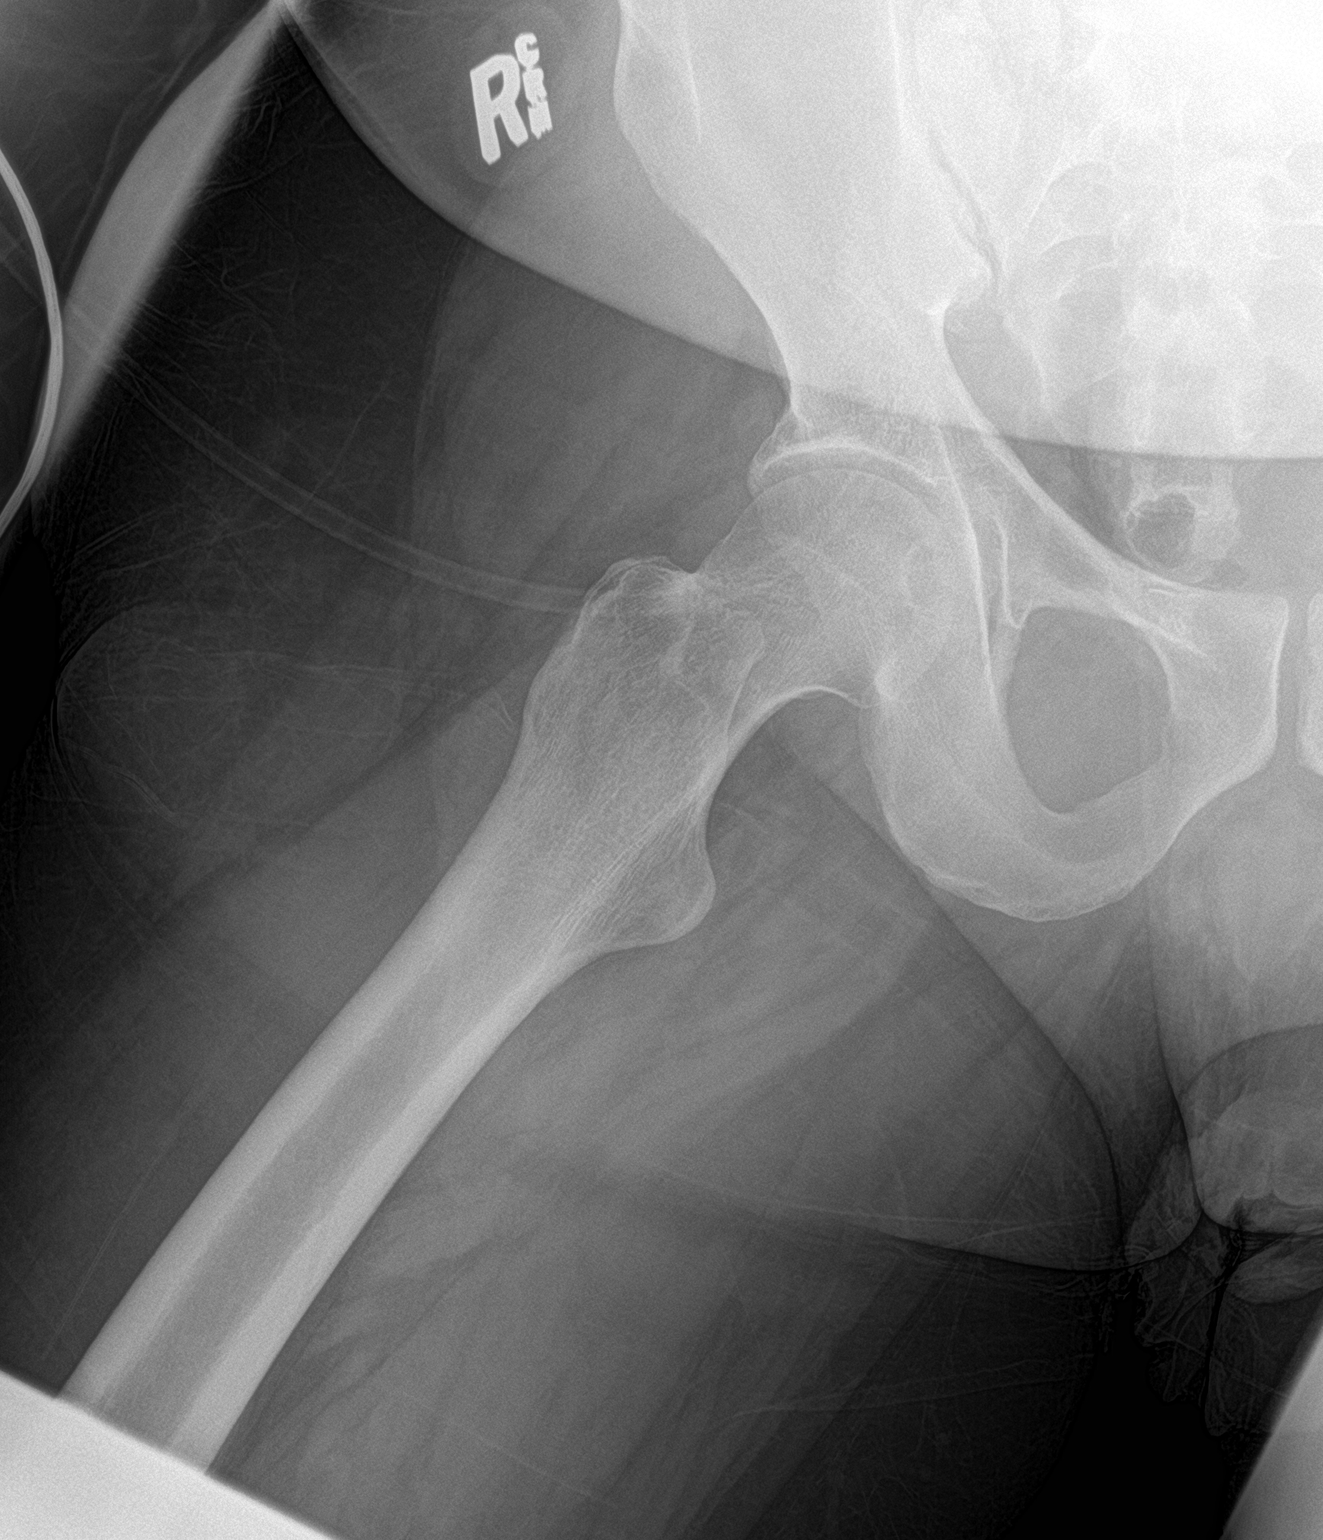

[3 of 3 positions shown; findings below may reference images not displayed]

FINDINGS: There is no evidence of hip fracture or dislocation. There is no
evidence of arthropathy or other focal bone abnormality.
IMPRESSION: No acute abnormality noted.

## 2019-04-06 IMAGING — DX DG KNEE COMPLETE 4+V*R*
4 series · 4 of 4 positions shown · non-contrast
Comparison: None.

CLINICAL DATA: Slip and fall with right knee pain, initial
encounter

EXAM:
RIGHT KNEE - COMPLETE 4+ VIEW

[knee ap]
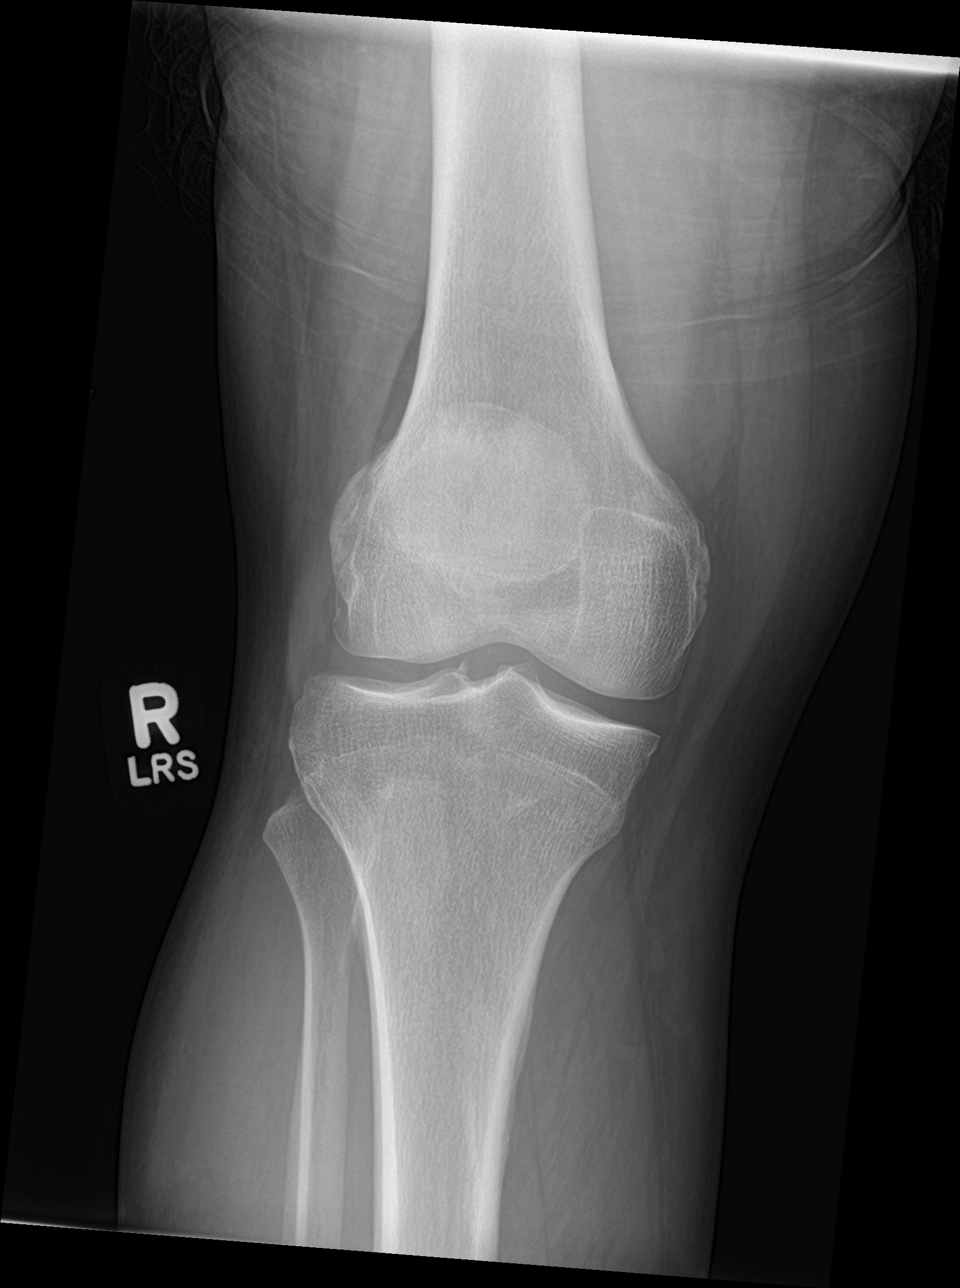

[knee lat]
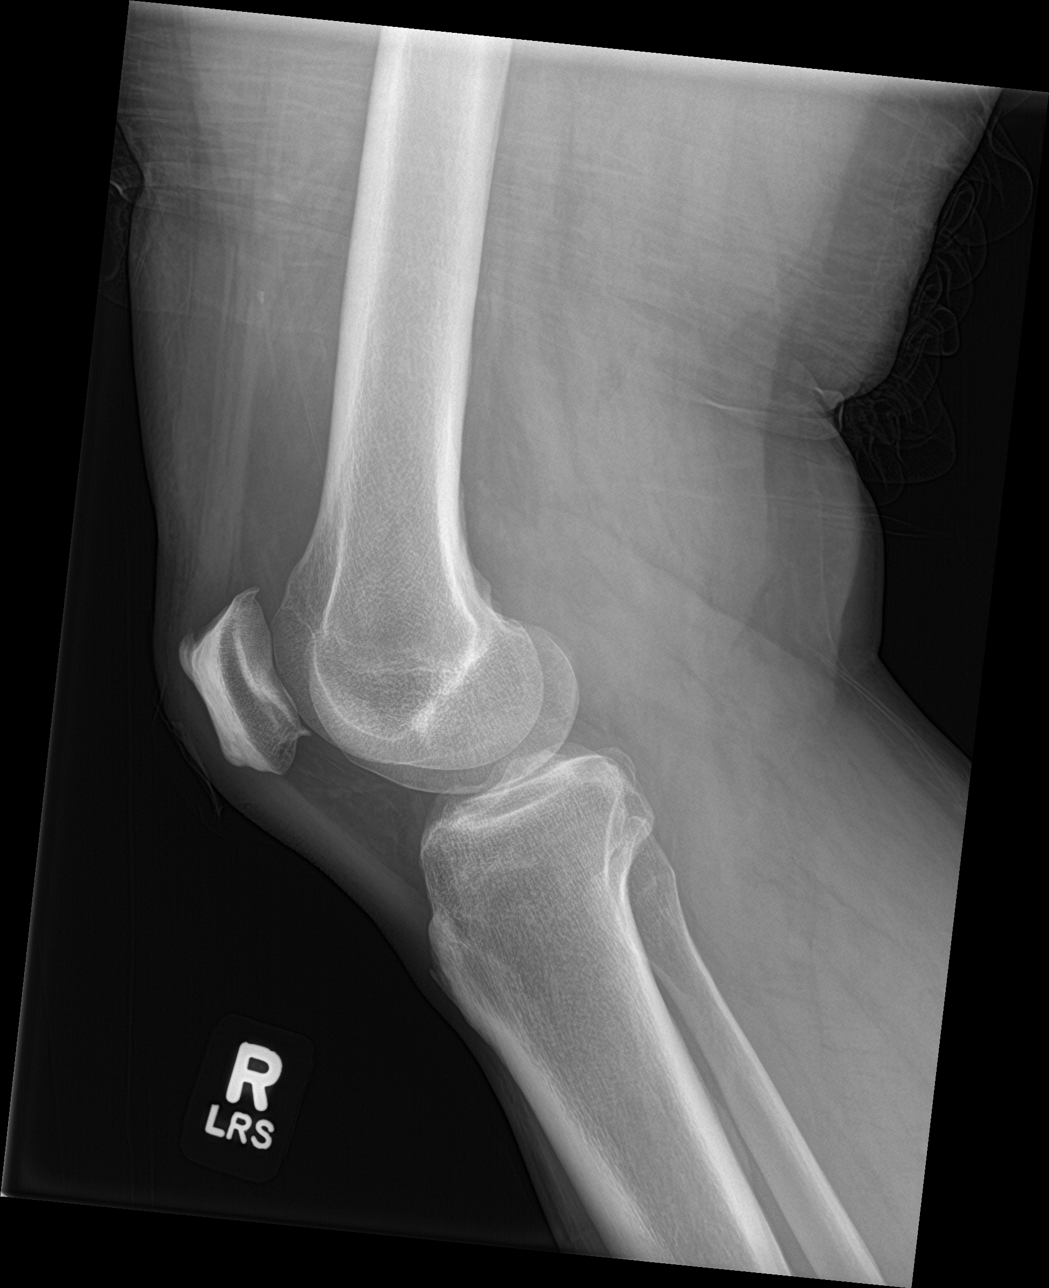

[knee obl (1 of 2)]
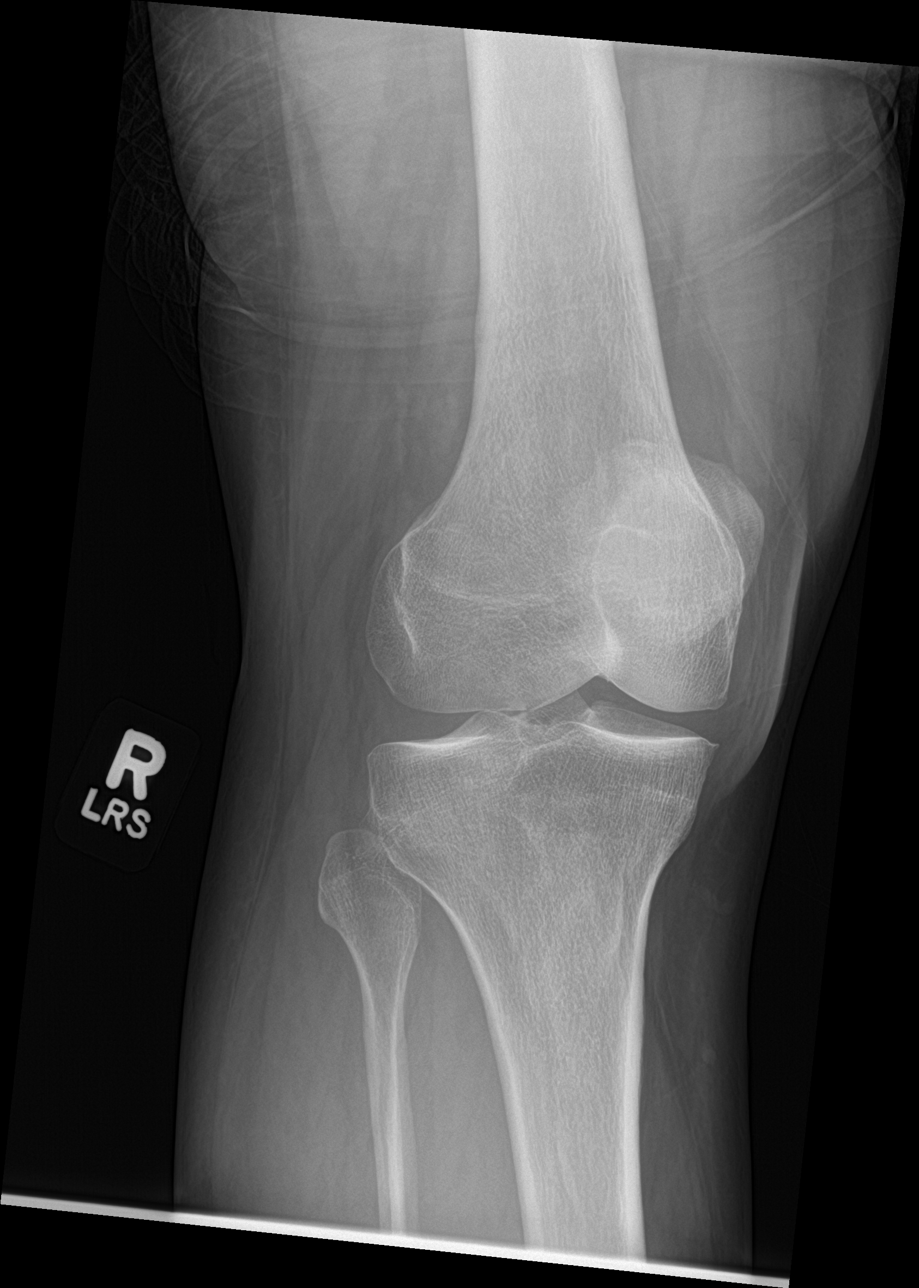

[knee obl (2 of 2)]
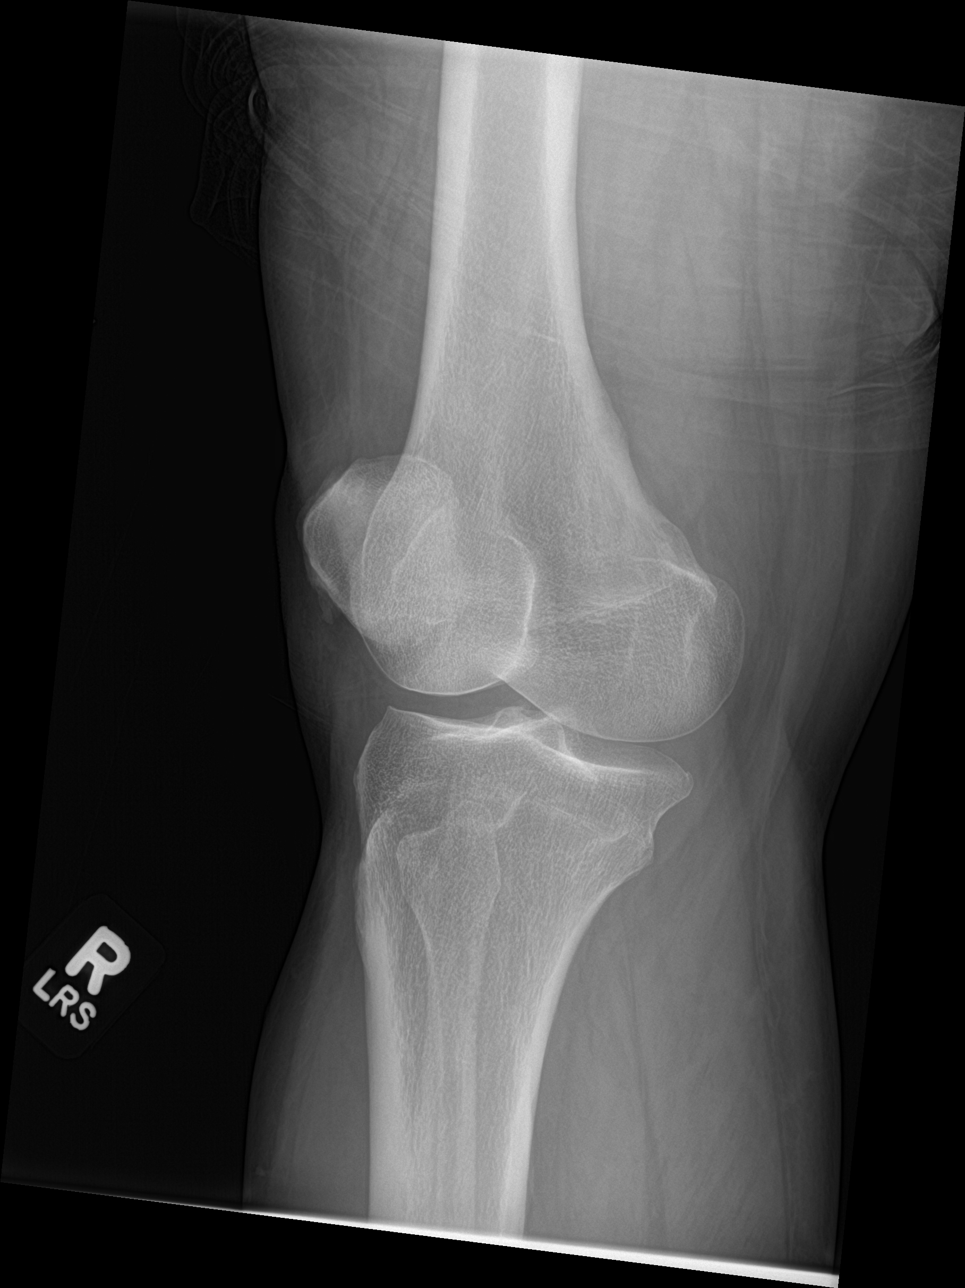

[4 of 4 positions shown; findings below may reference images not displayed]

FINDINGS: Mild degenerative changes are noted most marked in the
patellofemoral space. No joint effusion is seen. No acute fracture
or dislocation is noted.
IMPRESSION: Mild degenerative change without acute abnormality.

## 2019-04-07 LAB — CULTURE, GROUP A STREP (THRC)

## 2019-04-07 NOTE — ED Provider Notes (Signed)
Girard   [id number] 04/04/19 Arrival Time: 1936  ASSESSMENT & PLAN:  1. Sore throat     No signs of peritonsillar abscess. Discussed. Rapid strep negative. Culture sent. Doubt strep.    Discharge Instructions      You may use over the counter ibuprofen or acetaminophen as needed.   For a sore throat, over the counter products such as [employer] Peroxyl Mouth Sore Rinse or Chloraseptic Sore Throat Spray may provide some temporary relief.  Your rapid strep test was negative today. We have sent your throat swab for culture and will let you know of any positive results.      OTC analgesics and throat care as needed  Will follow up if not showing significant improvement over the next 24-48 hours.    Discharge Instructions      You may use over the counter ibuprofen or acetaminophen as needed.   For a sore throat, over the counter products such as [employer] Peroxyl Mouth Sore Rinse or Chloraseptic Sore Throat Spray may provide some temporary relief.  Your rapid strep test was negative today. We have sent your throat swab for culture and will let you know of any positive results.    Reviewed expectations re: course of current medical issues. Questions answered. Outlined signs and symptoms indicating need for more acute intervention. Patient verbalized understanding. After Visit Summary given.   SUBJECTIVE:  Trevor Garcia is a 48 y.o. male who reports a sore throat. Describes as "feeling scratchy". Onset abrupt beginning 1 day ago. Better today. No respiratory symptoms. Normal PO intake and normal swallowing. No voice changes. Fever reported: no. No neck pain or swelling. No associated n/v/abdominal symptoms. Sick contacts: co-worker with strep throat; "so I just wanted to get checked".  OTC treatment: none.  ROS: As per HPI.   OBJECTIVE:  Vitals:   04/04/19 2006  BP: (!) 148/93  Pulse: 74  Resp: 18  Temp: 98.1 F (36.7 C)  TempSrc: Oral   SpO2: 99%     General appearance: alert; no distress HEENT: throat appears normal; uvula midline: yes; no tonsillar hypertrophy Neck: supple with FROM; no lymphadenopathy CV: RRR Lungs: clear to auscultation bilaterally Abd: soft; non-tender Skin: reveals no rash; warm and dry Psychological: alert and cooperative; normal mood and affect  No Known Allergies  Past Medical History:  Diagnosis Date  . Cataract 2011   October and November  . Depression    Situational- since injury  . Diabetes mellitus without complication ([place])    Type II  . GERD (gastroesophageal reflux disease)   . Hypertension   . Seasonal allergies    Social History   Socioeconomic History  . Marital status: Married    Spouse name: Not on file  . Number of children: Not on file  . Years of education: Not on file  . Highest education level: Not on file  Occupational History  . Not on file  Social Needs  . Financial resource strain: Not on file  . Food insecurity    Worry: Not on file    Inability: Not on file  . Transportation needs    Medical: Not on file    Non-medical: Not on file  Tobacco Use  . Smoking status: Never Smoker  . Smokeless tobacco: Never Used  Substance and Sexual Activity  . Alcohol use: No    Alcohol/week: 0.0 standard drinks  . Drug use: No  . Sexual activity: Not on file  Lifestyle  . Physical  activity    Days per week: Not on file    Minutes per session: Not on file  . Stress: Not on file  Relationships  . Social Musicianconnections    Talks on phone: Not on file    Gets together: Not on file    Attends religious service: Not on file    Active member of club or organization: Not on file    Attends meetings of clubs or organizations: Not on file    Relationship status: Not on file  . Intimate partner violence    Fear of current or ex partner: Not on file    Emotionally abused: Not on file    Physically abused: Not on file    Forced sexual activity: Not on file  Other  Topics Concern  . Not on file  Social History Narrative   Exercise walking 2-3 times/week x 1 mile   Family History  Problem Relation Age of Onset  . Lupus Mother   . GER disease Sister           Mardella LaymanHagler, Jhordyn Hoopingarner, MD 04/07/19 1010

## 2019-04-09 ENCOUNTER — Telehealth (HOSPITAL_COMMUNITY): Payer: Self-pay | Admitting: Emergency Medicine

## 2019-04-09 NOTE — Telephone Encounter (Signed)
Called patient and asked how he was doing, pt states he feels much much better, but would call if anything changes. No treatment indicated.

## 2019-05-24 DEATH — deceased
